# Patient Record
Sex: Female | Born: 2000 | Race: Black or African American | Hispanic: No | Marital: Single | Smoking: Never smoker
Health system: Southern US, Community
[De-identification: ages and names within clinical notes are randomized; demographics above are authoritative.]

## PROBLEM LIST (undated history)

## (undated) ENCOUNTER — Inpatient Hospital Stay (HOSPITAL_COMMUNITY): Payer: Self-pay

## (undated) DIAGNOSIS — E611 Iron deficiency: Secondary | ICD-10-CM

## (undated) HISTORY — PX: NO PAST SURGERIES: SHX2092

---
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
## 2018-11-20 NOTE — L&D Delivery Note (Signed)
Delivery Note Called to bedside to repair a 3rd degree laceration.  Upon arrival, pt was comfortable, not actively bleeding, baby at bedside with father.  CNM reported placing 2 stiches to manage active bleeding and ordering Fentanyl for repair.  Anal sphincter was mostly approximated.  Small vaginal sulcus tears were repaired individually with 2.0 Chromic.  Vaginal mucosa avulsed from rectal space. After vagina repaired, this dead space was closed to 3rd degree.  1.0 Vicryl used to reapproximate perineum, sphincter.  Left labial laceration.  Rectal exam done before and after repair were normal.  Pt tolerated my portion well. ~5-10 ml noted during my portion.   Thurnell Lose 06/29/2019, 7:15 PM

## 2018-11-20 NOTE — L&D Delivery Note (Signed)
Delivery Note   Patient Name: Kristina Barnett DOB: 2001/11/06 MRN: [id number]  Date of admission: 06/29/2019 Delivering MD: Noralyn Pick  Date of delivery: 06/29/19 Type of delivery: SVD  Newborn Data: Live born female  Birth Weight: 7 lb 5.8 oz (3340 g) APGAR: 9, 9  Newborn Delivery   Birth date/time: 06/29/2019 18:08:00 Delivery type: Vaginal, Spontaneous      Harvie Bridge, 18 y.o., @ [redacted]w[redacted]d,  G1P1001, who was admitted for IOL for circumvallete cord insertion. I was called to the room when she progressed +2 station in the second stage of labor.  She pushed for 30/min. There was a tigth vaginal ring, and fetal decels to 60s, pt was unable to push infant head out, pt had good epidural and a midline episiotomy was cut.   She delivered a viable infant, cephalic and restituted to the OA position over an intact perineum.  A nuchal cord   was not identified. The baby was placed on maternal abdomen while initial step of NRP were perfmored (Dry, Stimulated, and warmed). Hat placed on baby for thermoregulation. Delayed cord clamping was performed for 1.5 minutes.  Cord double clamped and cut.  Cord cut by father. Apgar scores were 9 and 9. Prophylactic Pitocin was started in the third stage of labor for active management. The placenta delivered spontaneously, shultz, with a 3 vessel cord and was sent to LD.  Inspection revealed 3rd degree. Dr Simona Huh was called and repaired the 3rd degree, and I finished the second degree tearAn examination of the vaginal vault and cervix was free from lacerations. The uterus was firm, bleeding stable.  The repair was done under local and epidural and was given 169mcg of fentanyl.   Placenta was sent to pathology and umbilical artery blood gas were not sent.  There were no complications during the procedure.  Mom and baby skin to skin following delivery. Left in stable condition.  Maternal Info: Anesthesia:Epidural Episiotomy: Midline Lacerations:   3rd Suture Repair: Dr Simona Huh repaired the third and I repaired the second Est. Blood Loss (mL):  655  Newborn Info:  Baby Sex: female Circumcision: out pt desired Babies Name: Karter APGAR (1 MIN): 9   APGAR (5 MINS): 9   APGAR (10 MINS):     Mom to postpartum.  Baby to Couplet care / Skin to Skin.   [place], [place], NP-C 06/29/19 7:47 PM

## 2018-12-06 ENCOUNTER — Inpatient Hospital Stay (HOSPITAL_COMMUNITY): Payer: Self-pay

## 2018-12-06 ENCOUNTER — Inpatient Hospital Stay (HOSPITAL_COMMUNITY)
Admission: AD | Admit: 2018-12-06 | Discharge: 2018-12-06 | Disposition: A | Discharge status: Home / Self Care | Payer: Self-pay | Source: Ambulatory Visit | Attending: Family Medicine | Admitting: Family Medicine

## 2018-12-06 ENCOUNTER — Encounter (HOSPITAL_COMMUNITY): Payer: Self-pay | Admitting: Emergency Medicine

## 2018-12-06 DIAGNOSIS — O4690 Antepartum hemorrhage, unspecified, unspecified trimester: Secondary | ICD-10-CM

## 2018-12-06 DIAGNOSIS — O209 Hemorrhage in early pregnancy, unspecified: Secondary | ICD-10-CM | POA: Insufficient documentation

## 2018-12-06 DIAGNOSIS — Z3A10 10 weeks gestation of pregnancy: Secondary | ICD-10-CM | POA: Insufficient documentation

## 2018-12-06 LAB — TYPE AND SCREEN
ABO/RH(D): O POS
ANTIBODY SCREEN: NEGATIVE

## 2018-12-06 LAB — URINALYSIS, ROUTINE W REFLEX MICROSCOPIC
Bilirubin Urine: NEGATIVE
GLUCOSE, UA: NEGATIVE mg/dL
Ketones, ur: NEGATIVE mg/dL
NITRITE: POSITIVE — AB
PROTEIN: NEGATIVE mg/dL
Specific Gravity, Urine: 1.021 (ref 1.005–1.030)
pH: 6 (ref 5.0–8.0)

## 2018-12-06 LAB — WET PREP, GENITAL
Sperm: NONE SEEN
Trich, Wet Prep: NONE SEEN
Yeast Wet Prep HPF POC: NONE SEEN

## 2018-12-06 LAB — CBC
HCT: 37.4 % (ref 36.0–46.0)
Hemoglobin: 12.6 g/dL (ref 12.0–15.0)
MCH: 29.6 pg (ref 26.0–34.0)
MCHC: 33.7 g/dL (ref 30.0–36.0)
MCV: 88 fL (ref 80.0–100.0)
Platelets: 264 10*3/uL (ref 150–400)
RBC: 4.25 MIL/uL (ref 3.87–5.11)
RDW: 13.4 % (ref 11.5–15.5)
WBC: 6.4 10*3/uL (ref 4.0–10.5)
nRBC: 0 % (ref 0.0–0.2)

## 2018-12-06 LAB — HCG, QUANTITATIVE, PREGNANCY: hCG, Beta Chain, Quant, S: 170774 m[IU]/mL — ABNORMAL HIGH (ref <5)

## 2018-12-06 LAB — POCT PREGNANCY, URINE: Preg Test, Ur: POSITIVE — AB

## 2018-12-06 MED ORDER — PRENATAL VITAMIN 27-0.8 MG PO TABS: 1.0000 | ORAL_TABLET | Freq: Every day | ORAL | 7 refills | Status: DC

## 2018-12-06 NOTE — MAU Provider Note (Signed)
History    CSN: [id number] Arrival date and time: 12/06/18 2004 First Provider Initiated Contact with Patient 12/06/18 2034     CC: spotting and pregnancy   HPI  17yo G1P0 at 10w6 by LMP who presents to MAU for pregnancy confirmation and new-onset vaginal bleeding which started today. States small amount of light red spotting noticed today. Has just had a panty liner on. Denies recent intercourse. Denies any new sexual partners. Denies history of any sexually transmitted infections. Denies itchy, painful vaginal discharge. Reports occasional nausea, one episode of vomiting a few weeks ago. Feeling more tired. Surprised by desired pregnancy.   OB History    Gravida  1   Para      Term      Preterm      AB      Living        SAB      TAB      Ectopic      Multiple      Live Births              History reviewed. No pertinent past medical history.  History reviewed. No pertinent surgical history.  History reviewed. No pertinent family history.  Social History   Tobacco Use  . Smoking status: Never Smoker  . Smokeless tobacco: Never Used  Substance Use Topics  . Alcohol use: No  . Drug use: No    Allergies: No Known Allergies  Medications Prior to Admission  Medication Sig Dispense Refill Last Dose  . Pseudoeph-Doxylamine-DM-APAP (NYQUIL PO) Take 10 mLs by mouth every 4 (four) hours as needed (flu-like symptoms).   12/06/2013 at Unknown time    Review of Systems  Constitutional: Positive for fatigue. Negative for activity change and fever.  Respiratory: Negative for shortness of breath.   Cardiovascular: Negative for chest pain.  Gastrointestinal: Positive for nausea. Negative for abdominal pain, constipation, diarrhea and vomiting.  Endocrine: Negative for polyuria.  Genitourinary: Positive for vaginal bleeding. Negative for dysuria, pelvic pain, vaginal discharge and vaginal pain.  Musculoskeletal: Negative for back pain.  Neurological: Negative for  headaches.   Physical Exam   Blood pressure (!) 143/73, pulse 96, temperature 97.8 F (36.6 C), resp. rate 18, height 5' 7.5" (1.715 m), weight 76.2 kg, last menstrual period 09/21/2018.  Physical Exam  Nursing note and vitals reviewed. Constitutional: She is oriented to person, place, and time. She appears well-developed and well-nourished. No distress.  HENT:  Head: Normocephalic and atraumatic.  Eyes: Conjunctivae and EOM are normal. No scleral icterus.  Cardiovascular: Normal rate.  Respiratory: Effort normal and breath sounds normal. She has no wheezes.  GI: Soft. There is no abdominal tenderness. There is no guarding.  Genitourinary:    Genitourinary Comments: Normal appearing vulva and vagina  cervical ectropion noted with some friability, no obvious bleeding from os, cervix visually closed  no CMT, ovarian fullness or tenderness, anteverted uterus    Musculoskeletal:        General: No edema.  Neurological: She is alert and oriented to person, place, and time.  Skin: Skin is warm and dry. No rash noted.  Psychiatric: She has a normal mood and affect. Her behavior is normal.    MAU Course  Procedures  MDM -- given vaginal bleeding and pregnancy of unknown location, need to rule out ectopic pregnancy with U/S -- will also check CBC, T&S and quant hCG level - hCG 170,774 and O+ blood type  -- wet prep with evidence of  BV but asymptomatic, G/C sent -- U/A with evidence of possible UTI but asymptomatic, will send for OB culture   US OB Comp Less 14 Wks CLINICAL DATA:  Pregnant patient with vaginal bleeding.  EXAM: OBSTETRIC <14 WK ULTRASOUND  TECHNIQUE: Transabdominal ultrasound was performed for evaluation of the gestation as well as the maternal uterus and adnexal regions.  COMPARISON:  None.  FINDINGS: Intrauterine gestational sac: Single  Yolk sac:  Not Visualized.  Embryo:  Visualized.  Cardiac Activity: Visualized.  Heart Rate: 171 bpm  MSD:    mm     w     d  CRL:   40.1 mm   10 w 6 d                  [facility]: June 28, 2019  Subchorionic hemorrhage:  None visualized.  Maternal uterus/adnexae: Normal.  IMPRESSION: Single live IUP.  No cause for vaginal bleeding identified.  Electronically Signed   By: Gerome Sam III M.D   On: 12/06/2018 22:04   Assessment and Plan  17yo G1P0 at [redacted]w[redacted]d who presents for pregnancy test and vaginal bleeding. On exam, cervix with friable ectropion but no obvious signs of bleeding coming from os. Live IUP with FHR 170s identified on U/S. Recommended establishing prenatal care and taking prenatal vitamins. Bleeding and first trimester precautions reviewed. Patient also with signs of UTI on U/A but completely asymptomatic. Will follow-up OB urine culture before pursuing treatment. All questions answered and patient discharged home.   Tamera Stands, DO  12/06/2018, 9:12 PM

## 2018-12-06 NOTE — Discharge Instructions (Signed)
Vaginal Bleeding During Pregnancy, First Trimester ° °A small amount of bleeding from the vagina (spotting) is relatively common during early pregnancy. It usually stops on its own. Various things may cause bleeding or spotting during early pregnancy. Some bleeding may be related to the pregnancy, and some may not. In many cases, the bleeding is normal and is not a problem. However, bleeding can also be a sign of something serious. Be sure to tell your health care provider about any vaginal bleeding right away. °Some possible causes of vaginal bleeding during the first trimester include: °· Infection or inflammation of the cervix. °· Growths (polyps) on the cervix. °· Miscarriage or threatened miscarriage. °· Pregnancy tissue developing outside of the uterus (ectopic pregnancy). °· A mass of tissue developing in the uterus due to an egg being fertilized incorrectly (molar pregnancy). °Follow these instructions at home: °Activity °· Follow instructions from your health care provider about limiting your activity. Ask what activities are safe for you. °· If needed, make plans for someone to help with your regular activities. °· Do not have sex or orgasms until your health care provider says that this is safe. °General instructions °· Take over-the-counter and prescription medicines only as told by your health care provider. °· Pay attention to any changes in your symptoms. °· Do not use tampons or douche. °· Write down how many pads you use each day, how often you change pads, and how soaked (saturated) they are. °· If you pass any tissue from your vagina, save the tissue so you can show it to your health care provider. °· Keep all follow-up visits as told by your health care provider. This is important. °Contact a health care provider if: °· You have vaginal bleeding during any part of your pregnancy. °· You have cramps or labor pains. °· You have a fever. °Get help right away if: °· You have severe cramps in your  back or abdomen. °· You pass large clots or a large amount of tissue from your vagina. °· Your bleeding increases. °· You feel light-headed or weak, or you faint. °· You have chills. °· You are leaking fluid or have a gush of fluid from your vagina. °Summary °· A small amount of bleeding (spotting) from the vagina is relatively common during early pregnancy. °· Various things may cause bleeding or spotting in early pregnancy. °· Be sure to tell your health care provider about any vaginal bleeding right away. °This information is not intended to replace advice given to you by your health care provider. Make sure you discuss any questions you have with your health care provider. °Document Released: 08/16/2005 Document Revised: 02/08/2017 Document Reviewed: 02/08/2017 °Elsevier Interactive Patient Education © 2019 [employer] ° °

## 2018-12-06 NOTE — MAU Note (Signed)
I came to take pregnancy test. No period for 2 months. LMP 09/21/18. Has not taken upt. No birth control. Spotting today. No pain.

## 2018-12-07 LAB — ABO/RH: ABO/RH(D): O POS

## 2018-12-09 ENCOUNTER — Other Ambulatory Visit: Payer: Self-pay | Admitting: Family Medicine

## 2018-12-09 ENCOUNTER — Telehealth: Payer: Self-pay | Admitting: *Deleted

## 2018-12-09 ENCOUNTER — Telehealth: Payer: Self-pay | Admitting: Family Medicine

## 2018-12-09 ENCOUNTER — Encounter: Payer: Self-pay | Admitting: Family Medicine

## 2018-12-09 DIAGNOSIS — R8271 Bacteriuria: Secondary | ICD-10-CM

## 2018-12-09 DIAGNOSIS — O98819 Other maternal infectious and parasitic diseases complicating pregnancy, unspecified trimester: Secondary | ICD-10-CM

## 2018-12-09 DIAGNOSIS — O99891 Other specified diseases and conditions complicating pregnancy: Secondary | ICD-10-CM | POA: Insufficient documentation

## 2018-12-09 DIAGNOSIS — A749 Chlamydial infection, unspecified: Secondary | ICD-10-CM

## 2018-12-09 DIAGNOSIS — O9989 Other specified diseases and conditions complicating pregnancy, childbirth and the puerperium: Principal | ICD-10-CM

## 2018-12-09 LAB — CULTURE, OB URINE: Culture: >=100000 — AB

## 2018-12-09 LAB — GC/CHLAMYDIA PROBE AMP (~~LOC~~) NOT AT ARMC
Chlamydia: POSITIVE — AB
Neisseria Gonorrhea: NEGATIVE

## 2018-12-09 MED ORDER — CEFDINIR 300 MG PO CAPS: 300.0000 mg | ORAL_CAPSULE | Freq: Two times a day (BID) | ORAL | 0 refills | Status: DC

## 2018-12-09 MED ORDER — AZITHROMYCIN 250 MG PO TABS: 1000.0000 mg | ORAL_TABLET | Freq: Once | ORAL | 0 refills | Status: AC

## 2018-12-09 NOTE — Telephone Encounter (Signed)
Called patient and left voicemail regarding positive urine culture. Stated "results to review" and "medication at pharmacy" but no other details since no identifiable name in voicemail. Discussed urine culture results with pharmacy given resistance pattern - opted to treat with cefdinir 300mg  BID x 5 days. Will plan to repeat urine culture at first OB visit assuming started treatment. Patient had not decided in MAU where she would go to seek care.   Kristina Barnett. Earlene Plater, DO OB/GYN Fellow

## 2018-12-09 NOTE — Telephone Encounter (Signed)
Kristina Barnett left a voicemail this pm that she missed Dr.Wallace's call about her results.  I called Kristina Barnett and informed her Dr.Wallace wanted her to know she tested postive for a UTI and Dr. Earlene Plater sent a prescription for [facility] to her pharmacy and she should take all of it. I also informed her Dr. Earlene Plater recommended she get another urine culture at her first ob visit. She states she has not yet decided where she will go ;but voices understanding.

## 2018-12-09 NOTE — Telephone Encounter (Signed)
Called pt and informed her of +Chlamydia test result. Pt advised that a prescription has been sent to her pharmacy and dosage instructions were reviewed. Pt also advised that her partner requires treatment which may be received by his PCP or [facility]. She needs to wait for 2 weeks after taking Rx to have SI and 2 weeks after partner has been treated before having SI with him.  Pt voiced understanding. STI form completed and faxed to [facility].

## 2018-12-11 ENCOUNTER — Telehealth: Payer: Self-pay

## 2018-12-11 DIAGNOSIS — A749 Chlamydial infection, unspecified: Secondary | ICD-10-CM

## 2018-12-11 DIAGNOSIS — R8271 Bacteriuria: Secondary | ICD-10-CM

## 2018-12-11 DIAGNOSIS — O9989 Other specified diseases and conditions complicating pregnancy, childbirth and the puerperium: Secondary | ICD-10-CM

## 2018-12-11 MED ORDER — AZITHROMYCIN 1 G PO PACK: 1.0000 g | PACK | Freq: Once | ORAL | 0 refills | Status: AC

## 2018-12-11 MED ORDER — CEFDINIR 300 MG PO CAPS: 300.0000 mg | ORAL_CAPSULE | Freq: Two times a day (BID) | ORAL | 0 refills | Status: AC

## 2018-12-11 NOTE — Telephone Encounter (Signed)
Called pt and advised that we would send Azithromycin powder 1000 mg and Cefdinir capsules to her [facility] pharmacy. Asked patient would she be able to tolerate that, Patient stated that that would be okay and that she will pick up the Rx tomorrow and take it. Pt verbalized understanding and had no questions.

## 2018-12-11 NOTE — Telephone Encounter (Signed)
Patient called and stated that over the weekend she found out she was pregnant and that she had chlamydia she was given the medication to take but she has a hard time taking pills and threw them up so shes sure they are not in her system. Patient stated she was wondering if she could take something like a shot to help her cure it. Patient requests a call back.

## 2018-12-11 NOTE — Telephone Encounter (Signed)
Returned call to pt and left VM stating that I am calling to discuss her concern. Please call back and leave a message stating whether we may leave detailed information on her VM. Pt can be informed that we can prescribe the Azithromycin powder 1000 mg to treat the +Chlamydia. Per chart review, pt was prescribed Cefdinir capsules to treat +UTI - need to be sure pt is able to take that medication.

## 2019-01-07 ENCOUNTER — Ambulatory Visit (INDEPENDENT_AMBULATORY_CARE_PROVIDER_SITE_OTHER): Payer: Medicaid Other | Admitting: Obstetrics and Gynecology

## 2019-01-07 ENCOUNTER — Other Ambulatory Visit (HOSPITAL_COMMUNITY)
Admission: RE | Admit: 2019-01-07 | Discharge: 2019-01-07 | Disposition: A | Discharge status: Home / Self Care | Payer: Medicaid Other | Source: Ambulatory Visit | Attending: Obstetrics and Gynecology | Admitting: Obstetrics and Gynecology

## 2019-01-07 ENCOUNTER — Encounter: Payer: Self-pay | Admitting: Obstetrics and Gynecology

## 2019-01-07 VITALS — BP 128/77 | HR 112 | Wt 169.4 lb

## 2019-01-07 DIAGNOSIS — O9833 Other infections with a predominantly sexual mode of transmission complicating the puerperium: Secondary | ICD-10-CM

## 2019-01-07 DIAGNOSIS — O99891 Other specified diseases and conditions complicating pregnancy: Secondary | ICD-10-CM

## 2019-01-07 DIAGNOSIS — Z3482 Encounter for supervision of other normal pregnancy, second trimester: Secondary | ICD-10-CM

## 2019-01-07 DIAGNOSIS — Z23 Encounter for immunization: Secondary | ICD-10-CM

## 2019-01-07 DIAGNOSIS — O98811 Other maternal infectious and parasitic diseases complicating pregnancy, first trimester: Secondary | ICD-10-CM | POA: Diagnosis present

## 2019-01-07 DIAGNOSIS — A749 Chlamydial infection, unspecified: Secondary | ICD-10-CM

## 2019-01-07 DIAGNOSIS — O9989 Other specified diseases and conditions complicating pregnancy, childbirth and the puerperium: Secondary | ICD-10-CM

## 2019-01-07 DIAGNOSIS — R8271 Bacteriuria: Secondary | ICD-10-CM

## 2019-01-07 DIAGNOSIS — Z3490 Encounter for supervision of normal pregnancy, unspecified, unspecified trimester: Secondary | ICD-10-CM | POA: Insufficient documentation

## 2019-01-07 DIAGNOSIS — Z3492 Encounter for supervision of normal pregnancy, unspecified, second trimester: Secondary | ICD-10-CM

## 2019-01-07 DIAGNOSIS — O98812 Other maternal infectious and parasitic diseases complicating pregnancy, second trimester: Secondary | ICD-10-CM

## 2019-01-07 DIAGNOSIS — Z3A15 15 weeks gestation of pregnancy: Secondary | ICD-10-CM

## 2019-01-07 MED ORDER — CEFDINIR 250 MG/5ML PO SUSR: 300.0000 mg | Freq: Two times a day (BID) | ORAL | 0 refills | Status: AC

## 2019-01-07 MED ORDER — VITAFOL GUMMIES 3.33-0.333-34.8 MG PO CHEW: 3.0000 | CHEWABLE_TABLET | Freq: Every day | ORAL | 11 refills | Status: DC

## 2019-01-07 NOTE — Progress Notes (Signed)
Subjective:  Kristina Barnett is a 18 y.o. G1P0 at [redacted]w[redacted]d being seen today for her first OB visit. EDD by first trimester U/S. No chronic medical problems or medications. H/O CT, S/P Tx. H/O UTI did not take medication.  She is currently monitored for the following issues for this low-risk pregnancy and has Asymptomatic bacteriuria during pregnancy; Chlamydia infection affecting pregnancy; and Supervision of normal pregnancy, antepartum on their problem list.  Patient reports no complaints.   . Vag. Bleeding: None.  Movement: Absent. Denies leaking of fluid.   The following portions of the patient's history were reviewed and updated as appropriate: allergies, current medications, past family history, past medical history, past social history, past surgical history and problem list. Problem list updated.  Objective:   Vitals:   01/07/19 1411  BP: 128/77  Pulse: (!) 112  Weight: 169 lb 6.4 oz (76.8 kg)    Fetal Status: Fetal Heart Rate (bpm): 152   Movement: Absent     General:  Alert, oriented and cooperative. Patient is in no acute distress.  Skin: Skin is warm and dry. No rash noted.   Cardiovascular: Normal heart rate noted  Respiratory: Normal respiratory effort, no problems with respiration noted  Abdomen: Soft, gravid, appropriate for gestational age. Pain/Pressure: Absent     Pelvic:  Cervical exam deferred        Extremities: Normal range of motion.  Edema: None  Mental Status: Normal mood and affect. Normal behavior. Normal judgment and thought content.   Urinalysis:      Assessment and Plan:  Pregnancy: G1P0 at [redacted]w[redacted]d  1. Encounter for supervision of normal pregnancy, antepartum, unspecified gravidity Prenatal labs and care reviewed with pt.  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  Dispense: 90 tablet; Refill: 11  2. Flu vaccine need  - Flu Vaccine QUAD 36+ mos IM (Fluarix, Quad PF)  3. Chlamydia infection affecting pregnancy in first trimester TOC today - Urine cytology ancillary only  4. Asymptomatic bacteriuria during pregnancy Rx sent to pharmacy - cefdinir (OMNICEF) 250 MG/5ML suspension; Take 6 mLs (300 mg total) by mouth 2 (two) times daily for 5 days.  Dispense: 60 mL; Refill: 0  Preterm labor symptoms and general obstetric precautions including but not limited to vaginal bleeding, contractions, leaking of fluid and fetal movement were reviewed in detail with the patient. Please refer to After Visit Summary for other counseling recommendations.  Return in about 4 weeks (around 02/04/2019) for OB visit.   Hermina Staggers, MD

## 2019-01-07 NOTE — Patient Instructions (Signed)

## 2019-01-07 NOTE — Progress Notes (Signed)
NOB Planned :No  Genetic Screening Desires  Flu vaccine : desires.  U/S done on 12/06/18  Requesting Prenatal Gummie pt unable to swallow pills. Pt mother states if Ins will not cover she will pay cost.

## 2019-01-08 LAB — URINE CYTOLOGY ANCILLARY ONLY
Chlamydia: NEGATIVE
Neisseria Gonorrhea: NEGATIVE
Trichomonas: NEGATIVE

## 2019-01-10 LAB — URINE CYTOLOGY ANCILLARY ONLY: Candida vaginitis: NEGATIVE

## 2019-01-10 LAB — CULTURE, OB URINE

## 2019-01-10 LAB — URINE CULTURE, OB REFLEX

## 2019-01-16 ENCOUNTER — Other Ambulatory Visit: Payer: Self-pay

## 2019-01-16 ENCOUNTER — Telehealth: Payer: Self-pay

## 2019-01-16 MED ORDER — CEPHALEXIN 500 MG PO CAPS: 500.0000 mg | ORAL_CAPSULE | Freq: Two times a day (BID) | ORAL | 0 refills | Status: AC

## 2019-01-16 NOTE — Telephone Encounter (Signed)
-----   Message from Hermina Staggers, MD sent at 01/12/2019  5:01 AM EST ----- Keflex 500 mg po bid x 7 days Thanks Casimiro Needle

## 2019-01-16 NOTE — Progress Notes (Signed)
Rx sent to pharmacy and patient notified.   

## 2019-01-16 NOTE — Telephone Encounter (Signed)
Patient notified of Rx sent to her Pharmacy.

## 2019-01-17 LAB — OBSTETRIC PANEL, INCLUDING HIV
ANTIBODY SCREEN: NEGATIVE
BASOS: 0 %
Basophils Absolute: 0 10*3/uL (ref 0.0–0.2)
EOS (ABSOLUTE): 0.1 10*3/uL (ref 0.0–0.4)
EOS: 1 %
HIV SCREEN 4TH GENERATION: NONREACTIVE
Hematocrit: 35 % (ref 34.0–46.6)
Hemoglobin: 12 g/dL (ref 11.1–15.9)
Hepatitis B Surface Ag: NEGATIVE
Immature Grans (Abs): 0 10*3/uL (ref 0.0–0.1)
Immature Granulocytes: 0 %
Lymphocytes Absolute: 1.6 10*3/uL (ref 0.7–3.1)
Lymphs: 23 %
MCH: 30.5 pg (ref 26.6–33.0)
MCHC: 34.3 g/dL (ref 31.5–35.7)
MCV: 89 fL (ref 79–97)
Monocytes Absolute: 0.5 10*3/uL (ref 0.1–0.9)
Monocytes: 7 %
Neutrophils Absolute: 4.9 10*3/uL (ref 1.4–7.0)
Neutrophils: 69 %
Platelets: 245 10*3/uL (ref 150–450)
RBC: 3.94 x10E6/uL (ref 3.77–5.28)
RDW: 13.6 % (ref 11.7–15.4)
RH TYPE: POSITIVE
RPR Ser Ql: NONREACTIVE
Rubella Antibodies, IGG: 1.91 index (ref >0.99)
WBC: 7.1 10*3/uL (ref 3.4–10.8)

## 2019-01-17 LAB — AFP, SERUM, OPEN SPINA BIFIDA
AFP MoM: 0.73
AFP Value: 21.6 ng/mL
Gest. Age on Collection Date: 15 weeks
Maternal Age At EDD: 20 yr
OSBR Risk 1 IN: 10000
Test Results:: NEGATIVE
Weight: 169 [lb_av]

## 2019-01-17 LAB — HEMOGLOBINOPATHY EVALUATION
HEMOGLOBIN A2 QUANTITATION: 2.3 % (ref 1.8–3.2)
HGB C: 0 %
HGB S: 0 %
HGB VARIANT: 0 %
Hemoglobin F Quantitation: 0 % (ref 0.0–2.0)
Hgb A: 97.7 % (ref 96.4–98.8)

## 2019-01-17 LAB — CYSTIC FIBROSIS MUTATION 97: Interpretation: NOT DETECTED

## 2019-01-17 LAB — SMN1 COPY NUMBER ANALYSIS (SMA CARRIER SCREENING)

## 2019-01-28 ENCOUNTER — Encounter (HOSPITAL_COMMUNITY): Payer: Self-pay

## 2019-02-04 ENCOUNTER — Ambulatory Visit (HOSPITAL_COMMUNITY): Payer: Medicaid Other | Attending: Obstetrics and Gynecology

## 2019-02-04 ENCOUNTER — Encounter: Payer: Medicaid Other | Admitting: Obstetrics and Gynecology

## 2019-06-27 ENCOUNTER — Other Ambulatory Visit: Payer: Self-pay | Admitting: Obstetrics and Gynecology

## 2019-06-28 DIAGNOSIS — O43113 Circumvallate placenta, third trimester: Secondary | ICD-10-CM

## 2019-06-28 NOTE — H&P (Signed)
Kristina Barnett is a 18 y.o. female, G1P0 at 37 weeks, presenting for induction of labor for circumvallate placenta.  Prenatal hx remarkable for treatment of UTI x 2 and Chlamydia infection during the pregnancy.  Test of cures negative for both.  GBS positive  Patient Active Problem List   Diagnosis Date Noted  . Circumvallate placenta during pregnancy in third trimester, antepartum 06/28/2019  . Supervision of normal pregnancy, antepartum 01/07/2019  . Asymptomatic bacteriuria during pregnancy 12/09/2018  . Chlamydia infection affecting pregnancy 12/09/2018    History of present pregnancy: Patient entered care at 10 weeks.  Transfer to [facility] at 19 weeks EDC of 06/28/2019 was established by LMP/US.   Anatomy scan:  19 weeks, with normal findings and an anterior placenta with circumvallate cord insertion, slight lemon sign of head Additional [place] evaluations:  [place] to for growth normal [place] Significant prenatal events: MFM reported not a true lemon sign due to neg genetic test   Last evaluation: this week  OB History    Gravida  1   Para      Term      Preterm      AB      Living        SAB      TAB      Ectopic      Multiple      Live Births             No past medical history on file. No past surgical history on file. Family History: family history is not on file. Social History:  reports that she has never smoked. She has never used smokeless tobacco. She reports that she does not drink alcohol or use drugs.   Prenatal Transfer Tool  Maternal Diabetes: No Genetic Screening: Normal Maternal Ultrasounds/Referrals: Normal Fetal Ultrasounds or other Referrals:  None Maternal Substance Abuse:  No Significant Maternal Medications:  None Significant Maternal Lab Results: Group B Strep positive  TDAP Yes Flu No  ROS:  All ten systems reviewed and negative except as stated above  No Known Allergies     Last menstrual period 09/21/2018.  Chest clear Heart  RRR without murmur Abd gravid, NT, FH appropriate Pelvic: per RN Ext:Neg  FHR: Category 1 FHT 140 accels no decels UCs:  occ  Prenatal labs: ABO, Rh: O/Positive/-- (02/18 1519) Antibody: Negative (02/18 1519) Rubella:  1.91 (02/18 1519) RPR: Non Reactive (02/18 1519)  HBsAg: Negative (02/18 1519)  HIV: Non Reactive (02/18 1519)  GBS:  Positive Sickle cell/Hgb electrophoresis:  AA GC: Neg Chlamydia: Pos TOC negative Genetic screenings: NIPT neg AFP neg Glucola:  65 Other:   Hgb 12 at NOB, 10.6 at 28 weeks       Assessment/Plan: IUP at 40 weeks with circumvallate cord insertion Cat 1 strip GBS positive  Plan: Admit to [place]  Routine [facility] orders Pain med/epidural prn PCN G for GBS prophylaxis  Pleas Koch ProtheroCNM, MSN 06/29/2019, 12:34 AM

## 2019-06-29 ENCOUNTER — Inpatient Hospital Stay (HOSPITAL_COMMUNITY)
Admission: AD | Admit: 2019-06-29 | Discharge: 2019-07-01 | DRG: 768 | Disposition: A | Discharge status: Home / Self Care | Payer: Medicaid Other | Attending: Obstetrics and Gynecology | Admitting: Obstetrics and Gynecology

## 2019-06-29 ENCOUNTER — Encounter (HOSPITAL_COMMUNITY): Payer: Self-pay | Admitting: *Deleted

## 2019-06-29 ENCOUNTER — Inpatient Hospital Stay (HOSPITAL_COMMUNITY): Payer: Medicaid Other | Admitting: Anesthesiology

## 2019-06-29 ENCOUNTER — Inpatient Hospital Stay (HOSPITAL_COMMUNITY): Payer: Medicaid Other

## 2019-06-29 ENCOUNTER — Other Ambulatory Visit: Payer: Self-pay

## 2019-06-29 DIAGNOSIS — O43113 Circumvallate placenta, third trimester: Secondary | ICD-10-CM | POA: Diagnosis present

## 2019-06-29 DIAGNOSIS — O99824 Streptococcus B carrier state complicating childbirth: Secondary | ICD-10-CM | POA: Diagnosis present

## 2019-06-29 DIAGNOSIS — Z3A40 40 weeks gestation of pregnancy: Secondary | ICD-10-CM | POA: Diagnosis not present

## 2019-06-29 DIAGNOSIS — Z20828 Contact with and (suspected) exposure to other viral communicable diseases: Secondary | ICD-10-CM | POA: Diagnosis present

## 2019-06-29 DIAGNOSIS — Z3400 Encounter for supervision of normal first pregnancy, unspecified trimester: Secondary | ICD-10-CM

## 2019-06-29 DIAGNOSIS — O9081 Anemia of the puerperium: Secondary | ICD-10-CM | POA: Diagnosis not present

## 2019-06-29 LAB — CBC
HCT: 30 % — ABNORMAL LOW (ref 36.0–46.0)
Hemoglobin: 9.5 g/dL — ABNORMAL LOW (ref 12.0–15.0)
MCH: 26 pg (ref 26.0–34.0)
MCHC: 31.7 g/dL (ref 30.0–36.0)
MCV: 82.2 fL (ref 80.0–100.0)
Platelets: 212 10*3/uL (ref 150–400)
RBC: 3.65 MIL/uL — ABNORMAL LOW (ref 3.87–5.11)
RDW: 14.7 % (ref 11.5–15.5)
WBC: 6 10*3/uL (ref 4.0–10.5)
nRBC: 0 % (ref 0.0–0.2)

## 2019-06-29 LAB — TYPE AND SCREEN
ABO/RH(D): O POS
Antibody Screen: NEGATIVE

## 2019-06-29 LAB — ABO/RH: ABO/RH(D): O POS

## 2019-06-29 LAB — RPR: RPR Ser Ql: NONREACTIVE

## 2019-06-29 LAB — SARS CORONAVIRUS 2 BY RT PCR (HOSPITAL ORDER, PERFORMED IN ~~LOC~~ HOSPITAL LAB): SARS Coronavirus 2: NEGATIVE

## 2019-06-29 MED ORDER — SIMETHICONE 80 MG PO CHEW: 80.0000 mg | CHEWABLE_TABLET | ORAL | Status: DC | PRN

## 2019-06-29 MED ORDER — FENTANYL CITRATE (PF) 100 MCG/2ML IJ SOLN
INTRAMUSCULAR | Status: AC
  Filled 2019-06-29: qty 2

## 2019-06-29 MED ORDER — DIBUCAINE (PERIANAL) 1 % EX OINT: 1.0000 "application " | TOPICAL_OINTMENT | CUTANEOUS | Status: DC | PRN

## 2019-06-29 MED ORDER — FENTANYL-BUPIVACAINE-NACL 0.5-0.125-0.9 MG/250ML-% EP SOLN
12.0000 mL/h | EPIDURAL | Status: DC | PRN
  Filled 2019-06-29: qty 250

## 2019-06-29 MED ORDER — ONDANSETRON HCL 4 MG/2ML IJ SOLN: 4.0000 mg | INTRAMUSCULAR | Status: DC | PRN

## 2019-06-29 MED ORDER — PRENATAL MULTIVITAMIN CH
1.0000 | ORAL_TABLET | Freq: Every day | ORAL | Status: DC
  Administered 2019-06-30 – 2019-07-01 (×2): 1 via ORAL
  Filled 2019-06-29 (×2): qty 1

## 2019-06-29 MED ORDER — ACETAMINOPHEN 325 MG PO TABS: 650.0000 mg | ORAL_TABLET | ORAL | Status: DC | PRN

## 2019-06-29 MED ORDER — WITCH HAZEL-GLYCERIN EX PADS: 1.0000 "application " | MEDICATED_PAD | CUTANEOUS | Status: DC | PRN

## 2019-06-29 MED ORDER — LACTATED RINGERS IV SOLN: 500.0000 mL | INTRAVENOUS | Status: DC | PRN

## 2019-06-29 MED ORDER — ONDANSETRON HCL 4 MG/2ML IJ SOLN: 4.0000 mg | Freq: Four times a day (QID) | INTRAMUSCULAR | Status: DC | PRN

## 2019-06-29 MED ORDER — OXYTOCIN BOLUS FROM INFUSION
500.0000 mL | Freq: Once | INTRAVENOUS | Status: AC
  Administered 2019-06-29: 18:00:00 500 mL via INTRAVENOUS

## 2019-06-29 MED ORDER — ONDANSETRON HCL 4 MG PO TABS: 4.0000 mg | ORAL_TABLET | ORAL | Status: DC | PRN

## 2019-06-29 MED ORDER — SOD CITRATE-CITRIC ACID 500-334 MG/5ML PO SOLN: 30.0000 mL | ORAL | Status: DC | PRN

## 2019-06-29 MED ORDER — DIPHENHYDRAMINE HCL 25 MG PO CAPS: 25.0000 mg | ORAL_CAPSULE | Freq: Four times a day (QID) | ORAL | Status: DC | PRN

## 2019-06-29 MED ORDER — IBUPROFEN 600 MG PO TABS
600.0000 mg | ORAL_TABLET | Freq: Four times a day (QID) | ORAL | Status: DC
  Filled 2019-06-29: qty 1

## 2019-06-29 MED ORDER — MISOPROSTOL 25 MCG QUARTER TABLET
25.0000 ug | ORAL_TABLET | ORAL | Status: DC | PRN
  Administered 2019-06-29 (×2): 25 ug via VAGINAL
  Filled 2019-06-29 (×2): qty 1

## 2019-06-29 MED ORDER — PHENYLEPHRINE 40 MCG/ML (10ML) SYRINGE FOR IV PUSH (FOR BLOOD PRESSURE SUPPORT)
80.0000 ug | PREFILLED_SYRINGE | INTRAVENOUS | Status: DC | PRN
  Filled 2019-06-29: qty 10

## 2019-06-29 MED ORDER — LIDOCAINE HCL (PF) 1 % IJ SOLN
INTRAMUSCULAR | Status: DC | PRN
  Administered 2019-06-29 (×2): 5 mL via EPIDURAL

## 2019-06-29 MED ORDER — SODIUM CHLORIDE (PF) 0.9 % IJ SOLN
INTRAMUSCULAR | Status: DC | PRN
  Administered 2019-06-29: 12 mL/h via EPIDURAL

## 2019-06-29 MED ORDER — OXYCODONE-ACETAMINOPHEN 5-325 MG PO TABS: 1.0000 | ORAL_TABLET | ORAL | Status: DC | PRN

## 2019-06-29 MED ORDER — BENZOCAINE-MENTHOL 20-0.5 % EX AERO
1.0000 "application " | INHALATION_SPRAY | CUTANEOUS | Status: DC | PRN
  Administered 2019-06-29: 1 via TOPICAL
  Filled 2019-06-29: qty 56

## 2019-06-29 MED ORDER — TERBUTALINE SULFATE 1 MG/ML IJ SOLN: 0.2500 mg | Freq: Once | INTRAMUSCULAR | Status: DC | PRN

## 2019-06-29 MED ORDER — LACTATED RINGERS IV SOLN
500.0000 mL | Freq: Once | INTRAVENOUS | Status: AC
  Administered 2019-06-29: 500 mL via INTRAVENOUS

## 2019-06-29 MED ORDER — OXYCODONE-ACETAMINOPHEN 5-325 MG PO TABS: 2.0000 | ORAL_TABLET | ORAL | Status: DC | PRN

## 2019-06-29 MED ORDER — FENTANYL CITRATE (PF) 100 MCG/2ML IJ SOLN
100.0000 ug | Freq: Once | INTRAMUSCULAR | Status: AC
  Administered 2019-06-29: 19:00:00 100 ug via INTRAVENOUS

## 2019-06-29 MED ORDER — PENICILLIN G 3 MILLION UNITS IVPB - SIMPLE MED
3.0000 10*6.[IU] | INTRAVENOUS | Status: DC
  Administered 2019-06-29 (×3): 3 10*6.[IU] via INTRAVENOUS
  Filled 2019-06-29 (×3): qty 100

## 2019-06-29 MED ORDER — SENNOSIDES-DOCUSATE SODIUM 8.6-50 MG PO TABS
2.0000 | ORAL_TABLET | ORAL | Status: DC
  Filled 2019-06-29: qty 2

## 2019-06-29 MED ORDER — OXYTOCIN 40 UNITS IN NORMAL SALINE INFUSION - SIMPLE MED
1.0000 m[IU]/min | INTRAVENOUS | Status: DC
  Administered 2019-06-29: 2 m[IU]/min via INTRAVENOUS

## 2019-06-29 MED ORDER — SODIUM CHLORIDE 0.9 % IV SOLN
5.0000 10*6.[IU] | Freq: Once | INTRAVENOUS | Status: AC
  Administered 2019-06-29: 5 10*6.[IU] via INTRAVENOUS
  Filled 2019-06-29: qty 5

## 2019-06-29 MED ORDER — PHENYLEPHRINE 40 MCG/ML (10ML) SYRINGE FOR IV PUSH (FOR BLOOD PRESSURE SUPPORT)
80.0000 ug | PREFILLED_SYRINGE | INTRAVENOUS | Status: DC | PRN
  Administered 2019-06-29: 80 ug via INTRAVENOUS

## 2019-06-29 MED ORDER — IBUPROFEN 100 MG/5ML PO SUSP
600.0000 mg | Freq: Four times a day (QID) | ORAL | Status: DC
  Administered 2019-06-30 (×3): 600 mg via ORAL
  Filled 2019-06-29 (×4): qty 30

## 2019-06-29 MED ORDER — EPHEDRINE 5 MG/ML INJ: 10.0000 mg | INTRAVENOUS | Status: DC | PRN

## 2019-06-29 MED ORDER — DIPHENHYDRAMINE HCL 50 MG/ML IJ SOLN: 12.5000 mg | INTRAMUSCULAR | Status: DC | PRN

## 2019-06-29 MED ORDER — COCONUT OIL OIL: 1.0000 "application " | TOPICAL_OIL | Status: DC | PRN

## 2019-06-29 MED ORDER — ZOLPIDEM TARTRATE 5 MG PO TABS: 5.0000 mg | ORAL_TABLET | Freq: Every evening | ORAL | Status: DC | PRN

## 2019-06-29 MED ORDER — LIDOCAINE HCL (PF) 1 % IJ SOLN
30.0000 mL | INTRAMUSCULAR | Status: AC | PRN
  Administered 2019-06-29: 30 mL via SUBCUTANEOUS
  Filled 2019-06-29: qty 30

## 2019-06-29 MED ORDER — LACTATED RINGERS IV SOLN
INTRAVENOUS | Status: DC
  Administered 2019-06-29 (×3): via INTRAVENOUS

## 2019-06-29 MED ORDER — OXYTOCIN 40 UNITS IN NORMAL SALINE INFUSION - SIMPLE MED
2.5000 [IU]/h | INTRAVENOUS | Status: DC
  Filled 2019-06-29: qty 1000

## 2019-06-29 MED ORDER — TETANUS-DIPHTH-ACELL PERTUSSIS 5-2.5-18.5 LF-MCG/0.5 IM SUSP: 0.5000 mL | Freq: Once | INTRAMUSCULAR | Status: DC

## 2019-06-29 NOTE — Progress Notes (Signed)
Pt up to bathroom to try to void and change pad. Assisted pt to bathroom on steady. Pt able to void but became very dizzy while sitting on toilet. Assistance called for and arrived quickly. Pad quickly changed and pt assisted back to bed on steady with 2 RN assist. BP when back to bed 120/63. Cool cloth to forehead and encouraged pt to eat and drink, as she has not yet done so since delivery. Pt reports feeling much better back in bed. Will continue to monitor. Pt instructed to not get out of bed by herself and to call for assistance. Pt expressed understanding.

## 2019-06-29 NOTE — Progress Notes (Signed)
Labor Progress Note  Kristina Barnett, 18 y.o., G1P0, with an IUP @ [redacted]w[redacted]d, presented for presented for IOL for circumvallate cord insertion.   Subjective: Pt resting in bed alone, BF gone to get food, pt stable , endorses feeling mild cxt but tolerates well, wants epidural when feels more pain, pt aware of R/B/A for pitocin and agreeable to starting.  Patient Active Problem List   Diagnosis Date Noted  . Circumvallate placenta during pregnancy in third trimester, antepartum 06/28/2019  . Supervision of normal pregnancy, antepartum 01/07/2019  . Asymptomatic bacteriuria during pregnancy 12/09/2018  . Chlamydia infection affecting pregnancy 12/09/2018   Objective: BP 117/68   Pulse 87   Temp 99.5 F (37.5 C) (Oral)   Resp 18   Ht 5' 7.5" (1.715 m)   Wt 86.3 kg   LMP 09/21/2018   BMI 29.37 kg/m  No intake/output data recorded. No intake/output data recorded. NST: FHR baseline 120 bpm, Variability: moderate, Accelerations:present, Decelerations:  Absent= Cat 1/Reactive CTX:  none Uterus gravid, soft non tender, moderate to palpate with contractions.  SVE:  Dilation: 3 Effacement (%): 70 Station: -2 Exam by:: [employer], CNM Pitocin at (Start)mUn/min  Assessment:  Kristina Barnett, 18 y.o., G1P0, with an IUP @ [redacted]w[redacted]d, presented for IOL for circumvallate cord insertion. Pt stable and progress with x2 doses of Cytotec, plan to start pictocin, feels mild cxt, but tolerates well.  Patient Active Problem List   Diagnosis Date Noted  . Circumvallate placenta during pregnancy in third trimester, antepartum 06/28/2019  . Supervision of normal pregnancy, antepartum 01/07/2019  . Asymptomatic bacteriuria during pregnancy 12/09/2018  . Chlamydia infection affecting pregnancy 12/09/2018   NICHD: Category 1  Membranes:  Intact, no s/s of infection  Induction:    Cytotec x2, @  0100, 0500  Foley Bulb: inserted  No indicated  Pitocin - To start @ 2x2  Pain management:            IV pain management: PRN, 0             Epidural placement:  PRN  GBS Positive  Abx: x2 @ 4/8  Plan: Continue labor plan Continuous/intermittent monitoring Rest Ambulate Frequent position changes to facilitate fetal rotation and descent. Will reassess with cervical exam at 1400or earlier if necessary Start pitocin per protocol Epidural per pt request Baby Female: Out pt circ desired.  Anticipate labor progression and vaginal delivery.   [facility] aware of plan and verbalized agreement.   Noralyn Pick, NP-C, CNM, MSN 06/29/2019. 9:29 AM

## 2019-06-29 NOTE — Anesthesia Preprocedure Evaluation (Signed)

## 2019-06-29 NOTE — Anesthesia Procedure Notes (Signed)
Epidural Patient location during procedure: OB  Staffing Anesthesiologist: Casidy Alberta, MD Performed: anesthesiologist   Preanesthetic Checklist Completed: patient identified, site marked, surgical consent, pre-op evaluation, timeout performed, IV checked, risks and benefits discussed and monitors and equipment checked  Epidural Patient position: sitting Prep: DuraPrep Patient monitoring: heart rate, continuous pulse ox and blood pressure Approach: right paramedian Location: L3-L4 Injection technique: LOR saline  Needle:  Needle type: Tuohy  Needle gauge: 17 G Needle length: 9 cm and 9 Needle insertion depth: 6 cm Catheter type: closed end flexible Catheter size: 20 Guage Catheter at skin depth: 10 cm Test dose: negative  Assessment Events: blood not aspirated, injection not painful, no injection resistance, negative IV test and no paresthesia  Additional Notes Patient identified. Risks/Benefits/Options discussed with patient including but not limited to bleeding, infection, nerve damage, paralysis, failed block, incomplete pain control, headache, blood pressure changes, nausea, vomiting, reactions to medication both or allergic, itching and postpartum back pain. Confirmed with bedside nurse the patient's most recent platelet count. Confirmed with patient that they are not currently taking any anticoagulation, have any bleeding history or any family history of bleeding disorders. Patient expressed understanding and wished to proceed. All questions were answered. Sterile technique was used throughout the entire procedure. Please see nursing notes for vital signs. Test dose was given through epidural needle and negative prior to continuing to dose epidural or start infusion. Warning signs of high block given to the patient including shortness of breath, tingling/numbness in hands, complete motor block, or any concerning symptoms with instructions to call for help. Patient was given  instructions on fall risk and not to get out of bed. All questions and concerns addressed with instructions to call with any issues.     

## 2019-06-29 NOTE — Progress Notes (Signed)
Labor Progress Note  Kristina Barnett, 18 y.o., G1P0, with an IUP @ [redacted]w[redacted]d, presented for presented for IOL for circumvallate cord insertion.   Subjective: Pt resting in with BF at bedside and endorses starting to feel cxt. Discussed if no cervical change AROM, reviewed R/B/A, pt agreeable to AROM now and possible epidural when pain gets worse.  Patient Active Problem List   Diagnosis Date Noted  . Circumvallate placenta during pregnancy in third trimester, antepartum 06/28/2019  . Supervision of normal pregnancy, antepartum 01/07/2019  . Asymptomatic bacteriuria during pregnancy 12/09/2018  . Chlamydia infection affecting pregnancy 12/09/2018   Objective: BP 108/64   Pulse 64   Temp 98.1 F (36.7 C) (Oral)   Resp 16   Ht 5' 7.5" (1.715 m)   Wt 86.3 kg   LMP 09/21/2018   BMI 29.37 kg/m  No intake/output data recorded. No intake/output data recorded. NST: FHR baseline 120 bpm, Variability: moderate, Accelerations:present, Decelerations:  Absent= Cat 1/Reactive CTX:  none Uterus gravid, soft non tender, moderate to palpate with contractions.  SVE:  Dilation: 3 Effacement (%): 70 Station: -2 Exam by:: [facility], CNM Pitocin at (10)mUn/min  AROM, clear, small amount, fetus and pt tolerated well.   Assessment:  Kristina Barnett, 18 y.o., G1P0, with an IUP @ [redacted]w[redacted]d, presented for IOL for circumvallate cord insertion. Pt stable and progress on pitocin slow, feeling cxt after AROM.  Patient Active Problem List   Diagnosis Date Noted  . Circumvallate placenta during pregnancy in third trimester, antepartum 06/28/2019  . Supervision of normal pregnancy, antepartum 01/07/2019  . Asymptomatic bacteriuria during pregnancy 12/09/2018  . Chlamydia infection affecting pregnancy 12/09/2018   NICHD: Category 1  Membranes:  AROM clear, @ 1400 on 8/9, no s/s of infection  Induction:    Cytotec x2, @  0100, 0500  Foley Bulb: inserted  Never indicated  Pitocin - 10  Pain  management:               IV pain management: PRN, 0             Epidural placement:  PRN  GBS Positive  Abx: x2 @ 02/26/11  Plan: Continue labor plan Continuous monitoring Rest Ambulate Frequent position changes to facilitate fetal rotation and descent. Will reassess with cervical exam at 1900 or earlier if necessary Continue pitocin per protocol Epidural per pt request Baby Female: Out pt circ desired.  Anticipate labor progression and vaginal delivery.   Md Simona Huh to be updated PRN.    Noralyn Pick, NP-C, CNM, MSN 06/29/2019. 3:53 PM

## 2019-06-30 DIAGNOSIS — O9081 Anemia of the puerperium: Secondary | ICD-10-CM | POA: Diagnosis present

## 2019-06-30 LAB — CBC
HCT: 26.9 % — ABNORMAL LOW (ref 36.0–46.0)
Hemoglobin: 8.5 g/dL — ABNORMAL LOW (ref 12.0–15.0)
MCH: 26.1 pg (ref 26.0–34.0)
MCHC: 31.6 g/dL (ref 30.0–36.0)
MCV: 82.5 fL (ref 80.0–100.0)
Platelets: 181 10*3/uL (ref 150–400)
RBC: 3.26 MIL/uL — ABNORMAL LOW (ref 3.87–5.11)
RDW: 14.7 % (ref 11.5–15.5)
WBC: 7.9 10*3/uL (ref 4.0–10.5)
nRBC: 0 % (ref 0.0–0.2)

## 2019-06-30 MED ORDER — FERROUS SULFATE 325 (65 FE) MG PO TABS
325.0000 mg | ORAL_TABLET | Freq: Two times a day (BID) | ORAL | Status: DC
  Administered 2019-06-30 – 2019-07-01 (×3): 325 mg via ORAL
  Filled 2019-06-30 (×3): qty 1

## 2019-06-30 NOTE — Progress Notes (Signed)
Subjective: Postpartum Day # 1 : S/P NSVD due to IOL for circumvallate cord insertion. Patient up ad lib, denies syncope or dizziness. Reports consuming regular diet without issues and denies N/V. Patient reports 0 bowel movement + passing flatus.  Denies issues with urination and reports bleeding is "lighter."  Patient is bottle feeding and reports going well.  Desires undecided for postpartum contraception.  Pain is being appropriately managed with use of po meds. Pt endorses feeling fatigued.   3rd laceration Feeding:  bottle Contraceptive plan:  undecided BB: Circ out pt desired  Objective: Vital signs in last 24 hours: Patient Vitals for the past 24 hrs:  BP Temp Temp src Pulse Resp SpO2  06/30/19 0500 105/76 98.3 F (36.8 C) Oral 78 16 -  06/30/19 0130 109/68 98.1 F (36.7 C) Oral 94 20 99 %  06/29/19 2154 120/63 - - 63 - -  06/29/19 2136 121/75 98.6 F (37 C) Oral 90 18 -  06/29/19 2011 129/62 98.7 F (37.1 C) Oral 91 18 -  06/29/19 1942 - 98.5 F (36.9 C) Oral - 18 -  06/29/19 1902 (!) 98/50 - - 79 18 -  06/29/19 1845 102/76 - - 80 16 -  06/29/19 1832 91/75 - - 90 18 -  06/29/19 1731 117/77 - - 82 18 -  06/29/19 1701 (!) 99/56 - - 88 18 -  06/29/19 1631 (!) 115/54 - - 90 16 -  06/29/19 1601 129/67 - - 94 16 -  06/29/19 1531 108/64 98.1 F (36.7 C) Oral 64 16 -  06/29/19 1526 (!) 95/45 - - 85 16 -  06/29/19 1520 (!) 100/55 - - 76 16 -  06/29/19 1515 (!) 96/54 - - 83 16 -  06/29/19 1510 (!) 99/49 - - (!) 103 16 -  06/29/19 1505 112/67 - - 86 - -  06/29/19 1500 122/67 - - 85 16 -  06/29/19 1455 119/70 - - 89 16 -  06/29/19 1451 116/61 - - 76 18 -  06/29/19 1447 (!) 114/59 - - 79 16 -  06/29/19 1401 108/61 - - 86 16 -  06/29/19 1331 106/60 - - 86 18 -  06/29/19 1301 110/76 - - 85 18 -  06/29/19 1247 114/69 98.9 F (37.2 C) Oral 82 16 -  06/29/19 1151 107/65 - - 83 16 -  06/29/19 1107 (!) 100/56 - - 94 16 -  06/29/19 1035 (!) 112/54 - - 96 16 -  06/29/19 0949 (!)  116/58 - - 87 16 -  06/29/19 0804 117/68 - - 87 18 -     Physical Exam:  General: alert, cooperative, appears stated age and no distress Mood/Affect: happy Lungs: clear to auscultation, no wheezes, rales or rhonchi, symmetric air entry.  Heart: normal rate, regular rhythm, normal S1, S2, no murmurs, rubs, clicks or gallops. Breast: breasts appear normal, no suspicious masses, no skin or nipple changes or axillary nodes. Abdomen:  + bowel sounds, soft, non-tender GU: perineum aprroximate, healing well. No signs of external hematomas.  Uterine Fundus: firm Lochia: appropriate Skin: Warm, Dry. DVT Evaluation: No evidence of DVT seen on physical exam. Negative Homan's sign. No cords or calf tenderness. No significant calf/ankle edema.  CBC Latest Ref Rng & Units 06/30/2019 06/29/2019 01/07/2019  WBC 4.0 - 10.5 K/uL 7.9 6.0 7.1  Hemoglobin 12.0 - 15.0 g/dL 1.6(X8.5(L) 0.9(U9.5(L) 04.512.0  Hematocrit 36.0 - 46.0 % 26.9(L) 30.0(L) 35.0  Platelets 150 - 400 K/uL 181 212 245    Results  for orders placed or performed during the hospital encounter of 06/29/19 (from the past 24 hour(s))  CBC     Status: Abnormal   Collection Time: 06/30/19  4:52 AM  Result Value Ref Range   WBC 7.9 4.0 - 10.5 K/uL   RBC 3.26 (L) 3.87 - 5.11 MIL/uL   Hemoglobin 8.5 (L) 12.0 - 15.0 g/dL   HCT 26.9 (L) 36.0 - 46.0 %   MCV 82.5 80.0 - 100.0 fL   MCH 26.1 26.0 - 34.0 pg   MCHC 31.6 30.0 - 36.0 g/dL   RDW 14.7 11.5 - 15.5 %   Platelets 181 150 - 400 K/uL   nRBC 0.0 0.0 - 0.2 %     CBG (last 3)  No results for input(s): GLUCAP in the last 72 hours.   I/O last 3 completed shifts: In: 0  Out: 1780 [Urine:1125; Blood:655]   Assessment Postpartum Day # 1 : S/P NSVD due to IOL for circumvallate cord insertion. Pt stable. -1 involution. Bottle feeding. Hemodynamically stable with hgb drop from 9.5-8.5, mild symptomatic anemia with fatigue.   Plan: Continue other mgmt as ordered Anemia: Start Iron BID today and with  PNV Baby Female: Out pt circ desired.  VTE prophylactics: Early ambulated as tolerates.  Pain control: Motrin/Tylenol PRN Education given regarding options for contraception, including barrier methods, injectable contraception, IUD placement, oral contraceptives.  Plan for discharge tomorrow and Contraception undecided.   Dr. Mancel Bale to be updated on patient status  [facility] NP-C, CNM 06/30/2019, 7:03 AM

## 2019-06-30 NOTE — Anesthesia Postprocedure Evaluation (Signed)
Anesthesia Post Note  Patient: Kristina Barnett  Procedure(s) Performed: AN AD HOC LABOR EPIDURAL     Patient location during evaluation: Mother Baby Anesthesia Type: Epidural Level of consciousness: awake and alert Pain management: pain level controlled Vital Signs Assessment: post-procedure vital signs reviewed and stable Respiratory status: spontaneous breathing, nonlabored ventilation and respiratory function stable Cardiovascular status: stable Postop Assessment: no headache, no backache and epidural receding Anesthetic complications: no    Last Vitals:  Vitals:   06/30/19 0130 06/30/19 0500  BP: 109/68 105/76  Pulse: 94 78  Resp: 20 16  Temp: 36.7 C 36.8 C  SpO2: 99%     Last Pain:  Vitals:   06/30/19 0500  TempSrc: Oral  PainSc: 0-No pain   Pain Goal:                   Findley Vi

## 2019-06-30 NOTE — Discharge Summary (Signed)
SVD OB Discharge Summary     Patient Name: Kristina NapoleonCynterria C Broadwell DOB: 2001/01/01 MRN: [id number]  Date of admission: 06/29/2019 Delivering MD: Dale DurhamMONTANA, Alby Schwabe  Date of delivery: 06/29/2019 Type of delivery: SVD  Newborn Data: Sex: Baby female Circumcision: out pt desired Live born female  Birth Weight: 7 lb 5.8 oz (3340 g) APGAR: 9, 9  Newborn Delivery   Birth date/time: 06/29/2019 18:08:00 Delivery type: Vaginal, Spontaneous      Feeding: bottle Infant being discharge to home with mother in stable condition.   Admitting diagnosis: pregnancy Intrauterine pregnancy: 3822w1d     Secondary diagnosis:  Active Problems:   Circumvallate placenta during pregnancy in third trimester, antepartum   Postpartum anemia   Normal postpartum course                                Complications: None                                                              Intrapartum Procedures: spontaneous vaginal delivery and GBS prophylaxis Postpartum Procedures: none Complications-Operative and Postpartum: 3rd degree perineal laceration Augmentation: AROM, Pitocin and Cytotec   History of Present Illness: Ms. Kristina Barnett is a 18 y.o. female, G1P1001, who presents at 4522w1d weeks gestation. [facility]  Her pregnancy has been complicated by:  Patient Active Problem List   Diagnosis Date Noted  . Postpartum anemia 06/30/2019  . Normal postpartum course 06/30/2019  . Circumvallate placenta during pregnancy in third trimester, antepartum 06/28/2019  . Supervision of normal pregnancy, antepartum 01/07/2019  . Asymptomatic bacteriuria during pregnancy 12/09/2018  . Chlamydia infection affecting pregnancy 12/09/2018    Hospital course:  Induction of Labor With Vaginal Delivery   18 y.o. yo G1P1001 at 6722w1d was admitted to the hospital 06/29/2019 for induction of labor.  Indication for induction: curcumvallete cord insertion .  Patient  had an uncomplicated labor course as follows: Membrane Rupture Time/Date: 1:54 PM ,06/29/2019   Intrapartum Procedures: Episiotomy: Median [2]                                         Lacerations:  3rd degree [4]  Patient had delivery of a Viable infant.  Information for the patient's newborn:  Marcelino DusterWilliams, Boy Amisadai [[id number]][[id number]]  Delivery Method: Vag-Spont    06/29/2019  Details of delivery can be found in separate delivery note.  Patient had a routine postpartum course. Patient is discharged home 07/01/19. Postpartum Day # 2 : S/P NSVD due to circumvallate cord insertion . Patient up ad lib, denies syncope or dizziness. Reports consuming regular diet without issues and denies N/V. Patient reports 0 bowel movement + passing flatus.  Denies issues with urination and reports bleeding is "lighter."  Patient is bottle feeding and reports going well.  Desires undecided for postpartum contraception.  Pain is being appropriately managed with use of po meds. Pt hgb dropped from 9.5-8.5, endorses mild symptoms of fatigue, no other s/sx of anemia. Pt stable and ready to be discharged.    Physical exam  Vitals:  06/30/19 0130 06/30/19 0500 06/30/19 1058 06/30/19 2217  BP: 109/68 105/76 113/69 115/63  Pulse: 94 78 93 96  Resp: 20 16 18 18   Temp: 98.1 F (36.7 C) 98.3 F (36.8 C) 98.3 F (36.8 C) 99.4 F (37.4 C)  TempSrc: Oral Oral Oral Oral  SpO2: 99%     Weight:      Height:       General: alert, cooperative and no distress Lochia: appropriate Uterine Fundus: firm Perineum: approximate, no hemtomas noted DVT Evaluation: No evidence of DVT seen on physical exam. Negative Homan's sign. No cords or calf tenderness. No significant calf/ankle edema.  Labs: Lab Results  Component Value Date   WBC 7.9 06/30/2019   HGB 8.5 (L) 06/30/2019   HCT 26.9 (L) 06/30/2019   MCV 82.5 06/30/2019   PLT 181 06/30/2019   No flowsheet data found.  Date of discharge: 07/01/2019 Discharge Diagnoses: Term  Pregnancy-delivered Discharge instruction: per After Visit Summary and "Baby and Me Booklet".  After visit meds:   Activity:           unrestricted and pelvic rest Advance as tolerated. Pelvic rest for 6 weeks.  Diet:                routine Medications: PNV, Ibuprofen, Colace and Iron Postpartum contraception: Undecided Condition:  Pt discharge to home with baby in stable Anemia: Iron BID with PNV daily.  Baby Female: Out pt circ.   Meds: Iron, PNV, motrin, colace. Please read labels for directions.   Discharge Follow Up:  [facility] Follow up in 6 day(s).   Specialty: Obstetrics and Gynecology Why: make 1 week out pt baby female circ appointment and 6 weeks PPV.  Contact information: [place] [place] [address] [place], NP-C, CNM 07/01/2019, 12:17 AM  Noralyn Pick, [place]

## 2019-07-01 MED ORDER — FERROUS SULFATE 325 (65 FE) MG PO TABS: 325.0000 mg | ORAL_TABLET | Freq: Two times a day (BID) | ORAL | 3 refills | Status: DC

## 2019-07-01 MED ORDER — IBUPROFEN 100 MG/5ML PO SUSP: 600.0000 mg | Freq: Four times a day (QID) | ORAL | 0 refills | Status: AC

## 2019-08-06 ENCOUNTER — Other Ambulatory Visit: Payer: Self-pay

## 2019-08-06 DIAGNOSIS — Z20822 Contact with and (suspected) exposure to covid-19: Secondary | ICD-10-CM

## 2019-08-07 LAB — NOVEL CORONAVIRUS, NAA: SARS-CoV-2, NAA: NOT DETECTED

## 2019-09-15 ENCOUNTER — Other Ambulatory Visit: Payer: Self-pay

## 2019-09-15 ENCOUNTER — Encounter (HOSPITAL_COMMUNITY): Payer: Self-pay | Admitting: Emergency Medicine

## 2019-09-15 ENCOUNTER — Emergency Department (HOSPITAL_COMMUNITY)
Admission: EM | Admit: 2019-09-15 | Discharge: 2019-09-15 | Disposition: A | Discharge status: Home / Self Care | Payer: Medicaid Other | Attending: Emergency Medicine | Admitting: Emergency Medicine

## 2019-09-15 ENCOUNTER — Emergency Department (HOSPITAL_COMMUNITY): Payer: Medicaid Other

## 2019-09-15 DIAGNOSIS — M542 Cervicalgia: Secondary | ICD-10-CM | POA: Insufficient documentation

## 2019-09-15 DIAGNOSIS — Z79899 Other long term (current) drug therapy: Secondary | ICD-10-CM | POA: Diagnosis not present

## 2019-09-15 DIAGNOSIS — M549 Dorsalgia, unspecified: Secondary | ICD-10-CM | POA: Insufficient documentation

## 2019-09-15 LAB — POC URINE PREG, ED: Preg Test, Ur: NEGATIVE

## 2019-09-15 MED ORDER — NAPROXEN 375 MG PO TABS: 375.0000 mg | ORAL_TABLET | Freq: Two times a day (BID) | ORAL | 0 refills | Status: DC

## 2019-09-15 MED ORDER — METHOCARBAMOL 500 MG PO TABS: 500.0000 mg | ORAL_TABLET | Freq: Two times a day (BID) | ORAL | 0 refills | Status: DC
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
## 2019-09-15 NOTE — Discharge Instructions (Signed)
Get help right away if: °You have: °Numbness, tingling, or weakness in your arms or legs. °Severe neck pain, especially tenderness in the middle of the back of your neck. °Changes in bowel or bladder control. °Increasing pain in any area of your body. °Swelling in any area of your body, especially your legs. °Shortness of breath or light-headedness. °Chest pain. °Blood in your urine, stool, or vomit. °Severe pain in your abdomen or your back. °Severe or worsening headaches. °Sudden vision loss or double vision. °Your eye suddenly becomes red. °Your pupil is an odd shape or size. °

## 2020-10-18 ENCOUNTER — Encounter (HOSPITAL_COMMUNITY): Payer: Self-pay

## 2020-10-18 ENCOUNTER — Emergency Department (HOSPITAL_COMMUNITY)
Admission: EM | Admit: 2020-10-18 | Discharge: 2020-10-19 | Disposition: A | Discharge status: Home / Self Care | Payer: Self-pay | Attending: Emergency Medicine | Admitting: Emergency Medicine

## 2020-10-18 ENCOUNTER — Other Ambulatory Visit: Payer: Self-pay

## 2020-10-18 DIAGNOSIS — O23591 Infection of other part of genital tract in pregnancy, first trimester: Secondary | ICD-10-CM | POA: Insufficient documentation

## 2020-10-18 DIAGNOSIS — O209 Hemorrhage in early pregnancy, unspecified: Secondary | ICD-10-CM

## 2020-10-18 DIAGNOSIS — O23599 Infection of other part of genital tract in pregnancy, unspecified trimester: Secondary | ICD-10-CM

## 2020-10-18 DIAGNOSIS — N390 Urinary tract infection, site not specified: Secondary | ICD-10-CM

## 2020-10-18 DIAGNOSIS — Z3A00 Weeks of gestation of pregnancy not specified: Secondary | ICD-10-CM | POA: Insufficient documentation

## 2020-10-18 DIAGNOSIS — N939 Abnormal uterine and vaginal bleeding, unspecified: Secondary | ICD-10-CM

## 2020-10-18 DIAGNOSIS — B9689 Other specified bacterial agents as the cause of diseases classified elsewhere: Secondary | ICD-10-CM

## 2020-10-18 LAB — PREGNANCY, URINE: Preg Test, Ur: NEGATIVE

## 2020-10-18 NOTE — ED Triage Notes (Signed)
Patient arrived stating she found out she was pregnant yesterday. Reports tonight abdominal pain and nausea. States she passed some clots earlier today but has since stopped.

## 2020-10-18 NOTE — ED Provider Notes (Signed)
[facility]-EMERGENCY DEPT Provider Note   CSN: [id number] Arrival date & time: 10/18/20  2147    History Chief Complaint  Patient presents with  . Threatened Miscarriage    Kristina Barnett is a 19 y.o. female.  The history is provided by the patient.  She had a positive pregnancy test recently, last menses was beginning of this month.  She has had nausea for the last 2 days and some breast tenderness.  She is gravida 2, para one.  She has been having some lower abdominal cramping.  She rates pain at 6/10.  Earlier today, she had some vaginal spotting which is what prompted her to come to the emergency department.  She states that the bleeding has stopped.  History reviewed. No pertinent past medical history.  Patient Active Problem List   Diagnosis Date Noted  . Postpartum anemia 06/30/2019  . Normal postpartum course 06/30/2019  . Circumvallate placenta during pregnancy in third trimester, antepartum 06/28/2019  . Supervision of normal pregnancy, antepartum 01/07/2019  . Asymptomatic bacteriuria during pregnancy 12/09/2018  . Chlamydia infection affecting pregnancy 12/09/2018    History reviewed. No pertinent surgical history.   OB History    Gravida  2   Para  1   Term  1   Preterm      AB      Living  1     SAB      TAB      Ectopic      Multiple  0   Live Births  1           No family history on file.  Social History   Tobacco Use  . Smoking status: Never Smoker  . Smokeless tobacco: Never Used  Vaping Use  . Vaping Use: Never used  Substance Use Topics  . Alcohol use: No  . Drug use: No    Home Medications Prior to Admission medications   Medication Sig Start Date End Date Taking? Authorizing Provider  ferrous sulfate 325 (65 FE) MG tablet Take 1 tablet (325 mg total) by mouth 2 (two) times daily with a meal. 07/01/19   Dillard, Naima, MD  methocarbamol (ROBAXIN) 500 MG tablet Take 1 tablet (500 mg total)  by mouth 2 (two) times daily. 09/15/19   Harris, Cammy Copa, PA-C  naproxen (NAPROSYN) 375 MG tablet Take 1 tablet (375 mg total) by mouth 2 (two) times daily. 09/15/19   Arthor Captain, PA-C  Prenatal Vit-Fe Fumarate-FA (PRENATAL VITAMIN) 27-0.8 MG TABS Take 1 tablet by mouth daily. Patient not taking: Reported on 01/07/2019 12/06/18   Tamera Stands, DO    Allergies    Patient has no known allergies.  Review of Systems   Review of Systems  All other systems reviewed and are negative.   Physical Exam Updated Vital Signs BP 131/78 (BP Location: Left Arm)   Pulse 97   Temp 100 F (37.8 C) (Oral)   Resp 17   Ht 5\' 7"  (1.702 m)   Wt 77.1 kg   SpO2 100%   BMI 26.63 kg/m   Physical Exam Vitals and nursing note reviewed.   19 year old female, resting comfortably and in no acute distress. Vital signs are normal. Oxygen saturation is 100%, which is normal. Head is normocephalic and atraumatic. PERRLA, EOMI. Oropharynx is clear. Neck is nontender and supple without adenopathy or JVD. Back is nontender and there is no CVA tenderness. Lungs are clear without rales, wheezes, or rhonchi. Chest  is nontender. Heart has regular rate and rhythm without murmur. Abdomen is soft, flat, nontender without masses or hepatosplenomegaly and peristalsis is hypoactive. Pelvic: Normal external female genitalia.  Speculum exam was suboptimal due to inability to insert the speculum.  On manual exam, there is a firm density posterior to the vaginal introitus which limits entry.  There is no evidence of any vaginal bleeding, but I cannot assess the cervix or uterine size. Extremities have no cyanosis or edema, full range of motion is present. Skin is warm and dry without rash. Neurologic: Mental status is normal, cranial nerves are intact, there are no motor or sensory deficits.  ED Results / Procedures / Treatments   Labs (all labs ordered are listed, but only abnormal results are displayed) Labs  Reviewed  WET PREP, GENITAL - Abnormal; Notable for the following components:      Result Value   Clue Cells Wet Prep HPF POC PRESENT (*)    All other components within normal limits  BASIC METABOLIC PANEL - Abnormal; Notable for the following components:   Potassium 3.4 (*)    CO2 20 (*)    All other components within normal limits  CBC WITH DIFFERENTIAL/PLATELET - Abnormal; Notable for the following components:   Hemoglobin 9.0 (*)    HCT 31.2 (*)    MCV 70.4 (*)    MCH 20.3 (*)    MCHC 28.8 (*)    RDW 18.5 (*)    All other components within normal limits  HCG, QUANTITATIVE, PREGNANCY - Abnormal; Notable for the following components:   hCG, Beta Chain, Quant, S 48 (*)    All other components within normal limits  URINALYSIS, ROUTINE W REFLEX MICROSCOPIC - Abnormal; Notable for the following components:   Color, Urine AMBER (*)    APPearance TURBID (*)    Ketones, ur 5 (*)    Protein, ur 100 (*)    Nitrite POSITIVE (*)    Leukocytes,Ua LARGE (*)    WBC, UA >50 (*)    Bacteria, UA RARE (*)    Squamous Epithelial / LPF >50 (*)    Non Squamous Epithelial 0-5 (*)    All other components within normal limits  URINE CULTURE  PREGNANCY, URINE  RPR  HIV ANTIBODY (ROUTINE TESTING W REFLEX)  ABO/RH  GC/CHLAMYDIA PROBE AMP ([place]) NOT AT [facility]   Radiology US OB LESS THAN 14 WEEKS WITH OB TRANSVAGINAL  Result Date: 10/19/2020 CLINICAL DATA:  Vaginal bleeding, positive pregnancy test EXAM: OBSTETRIC <14 WK [place] AND TRANSVAGINAL OB US TECHNIQUE: Both transabdominal and transvaginal ultrasound examinations were performed for complete evaluation of the gestation as well as the maternal uterus, adnexal regions, and pelvic cul-de-sac. Transvaginal technique was performed to assess early pregnancy. COMPARISON:  None. FINDINGS: Intrauterine gestational sac: Absent Maternal uterus/adnexae: Uterus is well visualized and within normal limits with the exception of prominent endometrium at 3  cm. Ovaries are well visualized. A 2.1 cm cystic lesion is noted likely representing a prominent follicle. Is noted within the right ovary simple in nature. IMPRESSION: No intra or definitive extra uterine pregnancy is noted. Beta HCG level is extremely low. Continued follow-up of beta HCG is recommended. Repeat imaging can be performed as clinically necessary. Electronically Signed   By: Alcide Clever M.D.   On: 10/19/2020 02:13    Procedures Procedures  Medications Ordered in ED Medications  cephALEXin (KEFLEX) capsule 500 mg (has no administration in time range)    ED Course  I have reviewed  the triage vital signs and the nursing notes.  Pertinent labs & imaging results that were available during my care of the patient were reviewed by me and considered in my medical decision making (see chart for details).  MDM Rules/Calculators/A&P First trimester bleeding.  Old records are reviewed confirming prior pregnancy, Rh+ status.  Will check pelvic ultrasound.  Baseline hCG is ordered.  Ultrasound shows no evidence of IUP, but hCG has come back and only 48.  This indicates very early pregnancy which is consistent with LMP about 4 weeks ago.  Urinalysis does show signs of infection.  Wet prep does show bacterial vaginosis, will defer treatment decisions to her obstetrician.  Patient is referred to her obstetrician for repeat hCG in 48-72hours.  She is discharged with prescription for cephalexin and she is started back on prenatal vitamins.  Final Clinical Impression(s) / ED Diagnoses Final diagnoses:  First trimester bleeding  Bacterial vaginosis in pregnancy  Urinary tract infection without hematuria, site unspecified    Rx / DC Orders ED Discharge Orders         Ordered    Prenatal Vit-Fe Fumarate-FA (PRENATAL VITAMIN) 27-0.8 MG TABS  Daily        10/19/20 0243    cephALEXin (KEFLEX) 500 MG capsule  3 times daily        10/19/20 0243           Dione Booze, MD 10/19/20 0246

## 2020-10-19 ENCOUNTER — Emergency Department (HOSPITAL_COMMUNITY): Payer: Self-pay

## 2020-10-19 LAB — CBC WITH DIFFERENTIAL/PLATELET
Abs Immature Granulocytes: 0.02 10*3/uL (ref 0.00–0.07)
Basophils Absolute: 0 10*3/uL (ref 0.0–0.1)
Basophils Relative: 1 %
Eosinophils Absolute: 0.1 10*3/uL (ref 0.0–0.5)
Eosinophils Relative: 1 %
HCT: 31.2 % — ABNORMAL LOW (ref 36.0–46.0)
Hemoglobin: 9 g/dL — ABNORMAL LOW (ref 12.0–15.0)
Immature Granulocytes: 0 %
Lymphocytes Relative: 34 %
Lymphs Abs: 2.2 10*3/uL (ref 0.7–4.0)
MCH: 20.3 pg — ABNORMAL LOW (ref 26.0–34.0)
MCHC: 28.8 g/dL — ABNORMAL LOW (ref 30.0–36.0)
MCV: 70.4 fL — ABNORMAL LOW (ref 80.0–100.0)
Monocytes Absolute: 0.7 10*3/uL (ref 0.1–1.0)
Monocytes Relative: 11 %
Neutro Abs: 3.4 10*3/uL (ref 1.7–7.7)
Neutrophils Relative %: 53 %
Platelets: 284 10*3/uL (ref 150–400)
RBC: 4.43 MIL/uL (ref 3.87–5.11)
RDW: 18.5 % — ABNORMAL HIGH (ref 11.5–15.5)
WBC: 6.3 10*3/uL (ref 4.0–10.5)
nRBC: 0 % (ref 0.0–0.2)

## 2020-10-19 LAB — URINALYSIS, ROUTINE W REFLEX MICROSCOPIC
Bilirubin Urine: NEGATIVE
Glucose, UA: NEGATIVE mg/dL
Hgb urine dipstick: NEGATIVE
Ketones, ur: 5 mg/dL — AB
Nitrite: POSITIVE — AB
Protein, ur: 100 mg/dL — AB
Specific Gravity, Urine: 1.025 (ref 1.005–1.030)
Squamous Epithelial / LPF: >50 — ABNORMAL HIGH (ref 0–5)
WBC, UA: >50 WBC/hpf — ABNORMAL HIGH (ref 0–5)
pH: 5 (ref 5.0–8.0)

## 2020-10-19 LAB — GC/CHLAMYDIA PROBE AMP (~~LOC~~) NOT AT ARMC
Chlamydia: NEGATIVE
Comment: NEGATIVE
Comment: NORMAL
Neisseria Gonorrhea: NEGATIVE

## 2020-10-19 LAB — HIV ANTIBODY (ROUTINE TESTING W REFLEX): HIV Screen 4th Generation wRfx: NONREACTIVE

## 2020-10-19 LAB — WET PREP, GENITAL
Sperm: NONE SEEN
Trich, Wet Prep: NONE SEEN
WBC, Wet Prep HPF POC: NONE SEEN
Yeast Wet Prep HPF POC: NONE SEEN

## 2020-10-19 LAB — BASIC METABOLIC PANEL
Anion gap: 10 (ref 5–15)
BUN: 10 mg/dL (ref 6–20)
CO2: 20 mmol/L — ABNORMAL LOW (ref 22–32)
Calcium: 9.5 mg/dL (ref 8.9–10.3)
Chloride: 105 mmol/L (ref 98–111)
Creatinine, Ser: 0.7 mg/dL (ref 0.44–1.00)
GFR, Estimated: >60 mL/min (ref >60)
Glucose, Bld: 90 mg/dL (ref 70–99)
Potassium: 3.4 mmol/L — ABNORMAL LOW (ref 3.5–5.1)
Sodium: 135 mmol/L (ref 135–145)

## 2020-10-19 LAB — HCG, QUANTITATIVE, PREGNANCY: hCG, Beta Chain, Quant, S: 48 m[IU]/mL — ABNORMAL HIGH (ref <5)

## 2020-10-19 LAB — RPR: RPR Ser Ql: NONREACTIVE

## 2020-10-19 LAB — ABO/RH: ABO/RH(D): O POS

## 2020-10-19 MED ORDER — CEPHALEXIN 500 MG PO CAPS: 500.0000 mg | ORAL_CAPSULE | Freq: Three times a day (TID) | ORAL | 0 refills | Status: DC

## 2020-10-19 MED ORDER — CEPHALEXIN 500 MG PO CAPS
500.0000 mg | ORAL_CAPSULE | Freq: Once | ORAL | Status: DC
  Filled 2020-10-19: qty 1

## 2020-10-19 MED ORDER — PRENATAL VITAMIN 27-0.8 MG PO TABS: 1.0000 | ORAL_TABLET | Freq: Every day | ORAL | 0 refills | Status: AC

## 2020-10-19 MED ORDER — CEPHALEXIN 250 MG/5ML PO SUSR: 500.0000 mg | Freq: Three times a day (TID) | ORAL | 0 refills | Status: AC

## 2020-10-19 NOTE — Discharge Instructions (Signed)
Return if you are having any problems.  Your obstetrician will decide when and how to treat bacterial vaginosis.

## 2020-10-21 LAB — URINE CULTURE: Culture: >=100000 — AB

## 2020-10-22 ENCOUNTER — Telehealth: Payer: Self-pay | Admitting: *Deleted

## 2020-10-22 NOTE — Telephone Encounter (Signed)
Post ED Visit - Positive Culture Follow-up  Culture report reviewed by antimicrobial stewardship pharmacist: [facility] []  , Pharm.D. []  Enzo Bi, .D., BCPS AQ-ID []  Celedonio Miyamoto, Pharm.D., BCPS []  [address], Pharm.D., BCPS []  [place], Garvin Fila.D., BCPS, AAHIVP []  , Pharm.D., BCPS, AAHIVP []  Georgina Pillion, PharmD, BCPS []  , PharmD, BCPS []  [place], PharmD, BCPS []  [address], PharmD []  , PharmD, BCPS []  Estella Husk, PharmD  Pharmacy Team []  Lysle Pearl, PharmD []  , PharmD []  Phillips Climes, PharmD []  , Rph []  Agapito Games) , PharmD []  Verlan Friends, PharmD []  , PharmD []  Mervyn Gay, PharmD []  , PharmD []  Vinnie Level, PharmD []  [facility], PharmD []  , PharmD []  Len Childs, PharmD   Positive urine culture Treated with Cephalexin, organism sensitive to the same and no further patient follow-up is required at this time.  [facility] 10/22/2020, 1:15 PM

## 2021-07-11 ENCOUNTER — Encounter (HOSPITAL_COMMUNITY): Payer: Self-pay

## 2021-07-11 ENCOUNTER — Emergency Department (HOSPITAL_COMMUNITY)
Admission: EM | Admit: 2021-07-11 | Discharge: 2021-07-11 | Disposition: A | Discharge status: Home / Self Care | Payer: Medicaid Other | Attending: Emergency Medicine | Admitting: Emergency Medicine

## 2021-07-11 ENCOUNTER — Other Ambulatory Visit: Payer: Self-pay

## 2021-07-11 ENCOUNTER — Emergency Department (HOSPITAL_COMMUNITY): Payer: Medicaid Other

## 2021-07-11 DIAGNOSIS — M24411 Recurrent dislocation, right shoulder: Secondary | ICD-10-CM | POA: Diagnosis not present

## 2021-07-11 DIAGNOSIS — M25511 Pain in right shoulder: Secondary | ICD-10-CM | POA: Diagnosis present

## 2021-07-11 NOTE — ED Provider Notes (Signed)
[facility] EMERGENCY DEPARTMENT Provider Note   CSN: [id number] Arrival date & time: 07/11/21  1444     History No chief complaint on file.   Kristina Barnett is a 20 y.o. female.  HPI     20 year old female comes in with chief complaint of shoulder pain.  She has history of right-sided shoulder dislocation.  About 2 days ago, she said she was getting out of her bed, put weight on her stretched right extremity and her shoulder popped out and then reduced by itself.  Since then she has no increased pain, but has been having some tightness in the scapular region.  She is concerned that the shoulder might still be dislocated and has not been using it.  History reviewed. No pertinent past medical history.  Patient Active Problem List   Diagnosis Date Noted   Postpartum anemia 06/30/2019   Normal postpartum course 06/30/2019   Circumvallate placenta during pregnancy in third trimester, antepartum 06/28/2019   Supervision of normal pregnancy, antepartum 01/07/2019   Asymptomatic bacteriuria during pregnancy 12/09/2018   Chlamydia infection affecting pregnancy 12/09/2018    History reviewed. No pertinent surgical history.   OB History     Gravida  2   Para  1   Term  1   Preterm      AB      Living  1      SAB      IAB      Ectopic      Multiple  0   Live Births  1           No family history on file.  Social History   Tobacco Use   Smoking status: Never   Smokeless tobacco: Never  Vaping Use   Vaping Use: Never used  Substance Use Topics   Alcohol use: No   Drug use: No    Home Medications Prior to Admission medications   Medication Sig Start Date End Date Taking? Authorizing Provider  Prenatal Vit-Fe Fumarate-FA (PRENATAL VITAMIN) 27-0.8 MG TABS Take 1 tablet by mouth daily. 10/19/20   Dione Booze, MD  ferrous sulfate 325 (65 FE) MG tablet Take 1 tablet (325 mg total) by mouth 2 (two) times daily with a  meal. Patient not taking: Reported on 10/19/2020 07/01/19 10/19/20  Jaymes Graff, MD    Allergies    Patient has no known allergies.  Review of Systems   Review of Systems  Constitutional:  Positive for activity change.  Musculoskeletal:  Positive for arthralgias.   Physical Exam Updated Vital Signs BP 101/74 (BP Location: Left Arm)   Pulse (!) 59   Temp 99 F (37.2 C) (Oral)   Resp 16   SpO2 94%   Breastfeeding Unknown   Physical Exam Vitals and nursing note reviewed.  Constitutional:      Appearance: She is well-developed.  HENT:     Head: Atraumatic.  Cardiovascular:     Rate and Rhythm: Normal rate.  Pulmonary:     Effort: Pulmonary effort is normal.  Musculoskeletal:        General: No tenderness.     Cervical back: Normal range of motion and neck supple.     Comments: Able to abduct and allow for passive rotation of the shoulder without any difficulty  Skin:    General: Skin is warm and dry.  Neurological:     Mental Status: She is alert and oriented to person, place, and time.  ED Results / Procedures / Treatments   Labs (all labs ordered are listed, but only abnormal results are displayed) Labs Reviewed - No data to display  EKG None  Radiology DG Shoulder Right  Result Date: 07/11/2021 CLINICAL DATA:  Pain. Patient reports right shoulder came out of place on Sunday. EXAM: RIGHT SHOULDER - 2+ VIEW COMPARISON:  None. FINDINGS: The glenohumeral joint appears located. No fracture identified. There is no evidence of arthropathy or other focal bone abnormality. Soft tissues are unremarkable. IMPRESSION: Negative. Electronically Signed   By: Signa Kell M.D.   On: 07/11/2021 16:04    Procedures Procedures   Medications Ordered in ED Medications - No data to display  ED Course  I have reviewed the triage vital signs and the nursing notes.  Pertinent labs & imaging results that were available during my care of the patient were reviewed by me and  considered in my medical decision making (see chart for details).    MDM Rules/Calculators/A&P                           Patient with history of right-sided shoulder dislocation comes in with chief complaint of recurrence of it.  The insult occurred 2 days ago.  X-ray confirms that the shoulder is now reduced.  She is actually having no pain, just scapular discomfort which is likely because of spasms, as she has not been using her right shoulder.  Advised RICE treatment and advised no lifting weights for now until seen and cleared by orthopedist.  We will not put her in sling as she is symptom-free and I do not want to freeze her shoulder.  Final Clinical Impression(s) / ED Diagnoses Final diagnoses:  Recurrent shoulder dislocation, right    Rx / DC Orders ED Discharge Orders     None        Derwood Kaplan, MD 07/11/21 2238

## 2021-07-11 NOTE — Discharge Instructions (Addendum)
You likely have shoulder instability leading to recurrent dislocation  At this time, the shoulder is in place.  You likely have spasms in the back, causing more discomfort.  Do not lift anything more than 5 pounds until seen by orthopedist.  Avoid any kind of lifting above the shoulder/over your head.  Follow-up with the orthopedist in 7 to 10 days.

## 2021-07-11 NOTE — ED Triage Notes (Signed)
Patient complains of right shoulder coming out of place on Saturday. States she thinks back in place but something not right due to limited ROM

## 2021-07-11 NOTE — ED Provider Notes (Signed)
Emergency Medicine Provider Triage Evaluation Note  Kristina Barnett , a 20 y.o. female  was evaluated in triage.  Pt complains of ongoing right shoulder pain.  She states that this is the third time she has dislocated her shoulder.  She states that on Saturday she was using her right arm to push herself up on the bed and felt her shoulder pop out.  She states that it feels like it is partially back in.  She is able to move her shoulder however it still hurts and she states that it looks different than the other shoulder. She denies any other injuries, no numbness or weakness.    Review of Systems  Positive: Right shoulder pain Negative: Numbness, weakness  Physical Exam  BP 116/78 (BP Location: Left Arm)   Pulse 91   Temp 99 F (37.2 C) (Oral)   Resp 18   SpO2 100%  Gen:   Awake, no distress   Resp:  Normal effort  MSK:   Pain with movements on right shoulder.  She is unable to lift right arm above her head. Other:  2+ right radial pulse.  Sensation intact to light touch to right arm  Medical Decision Making  Medically screening exam initiated at 3:11 PM.  Appropriate orders placed.  [name] was informed that the remainder of the evaluation will be completed by another provider, this initial triage assessment does not replace that evaluation, and the importance of remaining in the ED until their evaluation is complete. Note: Portions of this report may have been transcribed using voice recognition software. Every effort was made to ensure accuracy; however, inadvertent computerized transcription errors may be present      Kristina Gong, PA-C 07/11/21 1513    Tegeler, Canary Brim, MD 07/11/21 [phone]

## 2021-07-18 ENCOUNTER — Encounter (HOSPITAL_COMMUNITY): Payer: Self-pay

## 2021-07-18 ENCOUNTER — Other Ambulatory Visit: Payer: Self-pay

## 2021-07-18 ENCOUNTER — Emergency Department (HOSPITAL_COMMUNITY): Payer: Medicaid Other

## 2021-07-18 ENCOUNTER — Emergency Department (HOSPITAL_COMMUNITY)
Admission: EM | Admit: 2021-07-18 | Discharge: 2021-07-18 | Disposition: A | Discharge status: Home / Self Care | Payer: Medicaid Other | Attending: Emergency Medicine | Admitting: Emergency Medicine

## 2021-07-18 DIAGNOSIS — K5900 Constipation, unspecified: Secondary | ICD-10-CM | POA: Diagnosis present

## 2021-07-18 DIAGNOSIS — K5909 Other constipation: Secondary | ICD-10-CM | POA: Diagnosis not present

## 2021-07-18 LAB — COMPREHENSIVE METABOLIC PANEL
ALT: 12 U/L (ref 0–44)
AST: 21 U/L (ref 15–41)
Albumin: 3.8 g/dL (ref 3.5–5.0)
Alkaline Phosphatase: 42 U/L (ref 38–126)
Anion gap: 8 (ref 5–15)
BUN: 7 mg/dL (ref 6–20)
CO2: 23 mmol/L (ref 22–32)
Calcium: 9.3 mg/dL (ref 8.9–10.3)
Chloride: 105 mmol/L (ref 98–111)
Creatinine, Ser: 0.73 mg/dL (ref 0.44–1.00)
GFR, Estimated: >60 mL/min (ref >60)
Glucose, Bld: 95 mg/dL (ref 70–99)
Potassium: 3.5 mmol/L (ref 3.5–5.1)
Sodium: 136 mmol/L (ref 135–145)
Total Bilirubin: 0.8 mg/dL (ref 0.3–1.2)
Total Protein: 8 g/dL (ref 6.5–8.1)

## 2021-07-18 LAB — I-STAT BETA HCG BLOOD, ED (MC, WL, AP ONLY): I-stat hCG, quantitative: 11.3 m[IU]/mL — ABNORMAL HIGH (ref <5)

## 2021-07-18 LAB — CBC WITH DIFFERENTIAL/PLATELET
Abs Immature Granulocytes: 0.02 10*3/uL (ref 0.00–0.07)
Basophils Absolute: 0 10*3/uL (ref 0.0–0.1)
Basophils Relative: 1 %
Eosinophils Absolute: 0.1 10*3/uL (ref 0.0–0.5)
Eosinophils Relative: 2 %
HCT: 34.8 % — ABNORMAL LOW (ref 36.0–46.0)
Hemoglobin: 9.8 g/dL — ABNORMAL LOW (ref 12.0–15.0)
Immature Granulocytes: 0 %
Lymphocytes Relative: 27 %
Lymphs Abs: 1.7 10*3/uL (ref 0.7–4.0)
MCH: 20.4 pg — ABNORMAL LOW (ref 26.0–34.0)
MCHC: 28.2 g/dL — ABNORMAL LOW (ref 30.0–36.0)
MCV: 72.3 fL — ABNORMAL LOW (ref 80.0–100.0)
Monocytes Absolute: 0.5 10*3/uL (ref 0.1–1.0)
Monocytes Relative: 8 %
Neutro Abs: 4 10*3/uL (ref 1.7–7.7)
Neutrophils Relative %: 62 %
Platelets: 415 10*3/uL — ABNORMAL HIGH (ref 150–400)
RBC: 4.81 MIL/uL (ref 3.87–5.11)
RDW: 19.3 % — ABNORMAL HIGH (ref 11.5–15.5)
WBC: 6.3 10*3/uL (ref 4.0–10.5)
nRBC: 0 % (ref 0.0–0.2)

## 2021-07-18 LAB — HCG, QUANTITATIVE, PREGNANCY: hCG, Beta Chain, Quant, S: 4 m[IU]/mL (ref <5)

## 2021-07-18 MED ORDER — SORBITOL 70 % SOLN
960.0000 mL | TOPICAL_OIL | Freq: Once | ORAL | Status: AC
  Administered 2021-07-18: 960 mL via RECTAL
  Filled 2021-07-18: qty 473

## 2021-07-18 MED ORDER — LIDOCAINE HCL URETHRAL/MUCOSAL 2 % EX GEL
1.0000 "application " | Freq: Once | CUTANEOUS | Status: AC
  Administered 2021-07-18: 1 via TOPICAL
  Filled 2021-07-18: qty 11

## 2021-07-18 MED ORDER — POLYETHYLENE GLYCOL 3350 17 GM/SCOOP PO POWD: 17.0000 g | Freq: Every day | ORAL | 0 refills | Status: DC

## 2021-07-18 NOTE — ED Provider Notes (Addendum)
Emergency Medicine Provider Triage Evaluation Note  Kristina Barnett , a 20 y.o. female  was evaluated in triage.  Patient presents with complaints of constipation, has not had a normal BM in 2 weeks, passed very small hard stool with a bit of blood about 1 week ago. Having associated abdominal & rectal discomfort. Recently started iron tablets. Passing gas without difficulty.   Review of Systems  Positive: Constipation, abdominal discomfort.  Negative: N/V, fever, dysuria, vaginal bleeding  Physical Exam  BP 108/69   Pulse (!) 103   Temp 98.6 F (37 C) (Oral)   Resp 16   SpO2 100%  Gen:   Awake, no distress   Resp:  Normal effort  MSK:   Moves extremities without difficulty  Other:  No peritoneal signs on abdominal exam.  Medical Decision Making  Medically screening exam initiated at 8:32 AM.  Appropriate orders placed.  MATTALYN ANDEREGG was informed that the remainder of the evaluation will be completed by another provider, this initial triage assessment does not replace that evaluation, and the importance of remaining in the ED until their evaluation is complete.  Constipation.    Cherly Anderson, PA-C 07/18/21 0835    Cherly Anderson, PA-C 07/18/21 [id number]    Wynetta Fines, MD 07/19/21 1014

## 2021-07-18 NOTE — Discharge Instructions (Addendum)
Please eat a high-fiber diet, drink MiraLAX daily to help regulate your bowel movement, and return if you have any concern.

## 2021-07-18 NOTE — ED Provider Notes (Signed)
[facility] EMERGENCY DEPARTMENT Provider Note   CSN: [id number] Arrival date & time: 07/18/21  4098     History No chief complaint on file.   Kristina Barnett is a 20 y.o. female.  The history is provided by the patient. No language interpreter was used.   20 year old female who presents with complaints of constipation.  Patient report for the past 2 weeks she has had persistent constipation producing very minimal bowel movement.  She report a lot of pressure about her rectum.  She is able to pass flatus.  She is currently taking iron supplementation and believes it may have contributed to her symptoms.  She tried using Dulcolax without relief.  She is not having any fever chills nausea vomiting or significant abdominal pain.  She recently had an abortion several weeks ago.  No prior abdominal surgery.  Does admit to having history of anemia taking iron supplementation.  History reviewed. No pertinent past medical history.  Patient Active Problem List   Diagnosis Date Noted   Postpartum anemia 06/30/2019   Normal postpartum course 06/30/2019   Circumvallate placenta during pregnancy in third trimester, antepartum 06/28/2019   Supervision of normal pregnancy, antepartum 01/07/2019   Asymptomatic bacteriuria during pregnancy 12/09/2018   Chlamydia infection affecting pregnancy 12/09/2018    History reviewed. No pertinent surgical history.   OB History     Gravida  2   Para  1   Term  1   Preterm      AB      Living  1      SAB      IAB      Ectopic      Multiple  0   Live Births  1           No family history on file.  Social History   Tobacco Use   Smoking status: Never   Smokeless tobacco: Never  Vaping Use   Vaping Use: Never used  Substance Use Topics   Alcohol use: No   Drug use: No    Home Medications Prior to Admission medications   Medication Sig Start Date End Date Taking? Authorizing Provider  Prenatal  Vit-Fe Fumarate-FA (PRENATAL VITAMIN) 27-0.8 MG TABS Take 1 tablet by mouth daily. 10/19/20   Dione Booze, MD  ferrous sulfate 325 (65 FE) MG tablet Take 1 tablet (325 mg total) by mouth 2 (two) times daily with a meal. Patient not taking: Reported on 10/19/2020 07/01/19 10/19/20  Jaymes Graff, MD    Allergies    Patient has no known allergies.  Review of Systems   Review of Systems  All other systems reviewed and are negative.  Physical Exam Updated Vital Signs BP 121/70 (BP Location: Left Arm)   Pulse 71   Temp 98.2 F (36.8 C)   Resp 14   SpO2 100%   Physical Exam Vitals and nursing note reviewed.  Constitutional:      General: She is not in acute distress.    Appearance: She is well-developed.  HENT:     Head: Atraumatic.  Eyes:     Conjunctiva/sclera: Conjunctivae normal.  Pulmonary:     Effort: Pulmonary effort is normal.  Abdominal:     Palpations: Abdomen is soft.     Tenderness: There is no abdominal tenderness.  Genitourinary:    Comments: Chaperone present during exam.  Normal rectal tone no obvious mass, firm stool appreciated at the rectal vault.  Normal color stool on glove.  Musculoskeletal:     Cervical back: Neck supple.  Skin:    Findings: No rash.  Neurological:     Mental Status: She is alert.  Psychiatric:        Mood and Affect: Mood normal.    ED Results / Procedures / Treatments   Labs (all labs ordered are listed, but only abnormal results are displayed) Labs Reviewed  CBC WITH DIFFERENTIAL/PLATELET - Abnormal; Notable for the following components:      Result Value   Hemoglobin 9.8 (*)    HCT 34.8 (*)    MCV 72.3 (*)    MCH 20.4 (*)    MCHC 28.2 (*)    RDW 19.3 (*)    Platelets 415 (*)    All other components within normal limits  I-STAT BETA HCG BLOOD, ED ([facility], [facility], [facility] ONLY) - Abnormal; Notable for the following components:   I-stat hCG, quantitative 11.3 (*)    All other components within normal limits  COMPREHENSIVE METABOLIC  PANEL  HCG, QUANTITATIVE, PREGNANCY    EKG None  Radiology DG Abd Acute W/Chest  Result Date: 07/18/2021 CLINICAL DATA:  Constipation. EXAM: DG ABDOMEN ACUTE WITH 1 VIEW CHEST COMPARISON:  Chest x-ray 12/06/2013 FINDINGS: The lungs are clear without focal pneumonia, edema, pneumothorax or pleural effusion. The cardiopericardial silhouette is within normal limits for size. Upright film shows no evidence for intraperitoneal free air. No small bowel dilatation to suggest obstruction. Markedly large formed stool volume seen in the left colon. Rectum is dramatically distended up to about 13 cm. Thoracolumbar scoliosis evident. IMPRESSION: Markedly large stool volume in the left colon with dramatic distention of the rectum. Correlation for fecal impaction recommended. Electronically Signed   By: Kennith Center M.D.   On: 07/18/2021 12:30    Procedures Fecal disimpaction  Date/Time: 07/18/2021 2:08 PM Performed by: Fayrene Helper, PA-C Authorized by: Fayrene Helper, PA-C  Consent: Verbal consent obtained. Risks and benefits: risks, benefits and alternatives were discussed Consent given by: patient Patient understanding: patient states understanding of the procedure being performed Patient consent: the patient's understanding of the procedure matches consent given Procedure consent: procedure consent matches procedure scheduled Local anesthesia used: yes  Anesthesia: Local anesthesia used: yes Local Anesthetic: topical anesthetic Anesthetic total: 8 mL  Sedation: Patient sedated: no  Patient tolerance: patient tolerated the procedure well with no immediate complications Comments: Manual disimpaction performed by me with chaperone present.  I was able to manually disimpact a large amount of stool.  Patient tolerates well.     Medications Ordered in ED Medications  lidocaine (XYLOCAINE) 2 % jelly 1 application (1 application Topical Given 07/18/21 1644)  sorbitol, milk of mag, mineral oil,  glycerin (SMOG) enema (960 mLs Rectal Given 07/18/21 1644)    ED Course  I have reviewed the triage vital signs and the nursing notes.  Pertinent labs & imaging results that were available during my care of the patient were reviewed by me and considered in my medical decision making (see chart for details).    MDM Rules/Calculators/A&P                           BP 117/60   Pulse 84   Temp 98.2 F (36.8 C)   Resp 16   SpO2 95%   Final Clinical Impression(s) / ED Diagnoses Final diagnoses:  Other constipation    Rx / DC Orders ED Discharge Orders  Ordered    polyethylene glycol powder (GLYCOLAX/MIRALAX) 17 GM/SCOOP powder  Daily        07/18/21 1737           1:31 PM Patient here with complaints of constipation for the past 2 weeks.  Acute abdominal series obtained showing some moderately large stool volume in the left colon with traumatic distention of the rectum.  This finding is consistent with physical exam.  Will perform manual disimpaction.  2:09 PM I was able to manually disimpact a large firm stool.  We will give smog enema to help evacuate bowel.  4:28 PM Patient is still overweight smog enema.  She did attempt to eat have a bowel movement without any success.  5:36 PM Patient received smog enema, able to produce a large bowel movement and felt much better.  She is stable for discharge.  Recommend MiraLAX.  Return precaution given.   Fayrene Helper, PA-C 07/18/21 1738    Gloris Manchester, MD 07/20/21 [phone]

## 2021-07-18 NOTE — ED Triage Notes (Signed)
Patient complains of constipation x 1-2 weeks and has been using laxatives with minimal relief. Patient complains of abdominal pain with same

## 2021-07-27 ENCOUNTER — Other Ambulatory Visit: Payer: Self-pay | Admitting: Orthopedic Surgery

## 2021-07-27 DIAGNOSIS — M25311 Other instability, right shoulder: Secondary | ICD-10-CM

## 2021-08-02 ENCOUNTER — Other Ambulatory Visit: Payer: Self-pay | Admitting: Orthopedic Surgery

## 2021-08-02 DIAGNOSIS — M25311 Other instability, right shoulder: Secondary | ICD-10-CM

## 2021-08-18 ENCOUNTER — Other Ambulatory Visit: Payer: Medicaid Other
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
## 2023-06-27 LAB — OB RESULTS CONSOLE RUBELLA ANTIBODY, IGM: Rubella: IMMUNE

## 2023-07-15 ENCOUNTER — Emergency Department (HOSPITAL_COMMUNITY)
Admission: EM | Admit: 2023-07-15 | Discharge: 2023-07-15 | Disposition: A | Discharge status: Home / Self Care | Payer: Managed Care, Other (non HMO) | Attending: Emergency Medicine | Admitting: Emergency Medicine

## 2023-07-15 ENCOUNTER — Encounter (HOSPITAL_COMMUNITY): Payer: Self-pay

## 2023-07-15 ENCOUNTER — Other Ambulatory Visit: Payer: Self-pay

## 2023-07-15 DIAGNOSIS — O26899 Other specified pregnancy related conditions, unspecified trimester: Secondary | ICD-10-CM | POA: Diagnosis present

## 2023-07-15 DIAGNOSIS — S0083XA Contusion of other part of head, initial encounter: Secondary | ICD-10-CM | POA: Diagnosis not present

## 2023-07-15 DIAGNOSIS — O9A219 Injury, poisoning and certain other consequences of external causes complicating pregnancy, unspecified trimester: Secondary | ICD-10-CM | POA: Diagnosis not present

## 2023-07-15 DIAGNOSIS — Z3A00 Weeks of gestation of pregnancy not specified: Secondary | ICD-10-CM | POA: Diagnosis not present

## 2023-07-15 NOTE — ED Notes (Signed)
AVS provided to and discussed with patient and family member at bedside. Pt verbalizes understanding of discharge instructions and denies any questions or concerns at this time. Pt has ride home. Pt ambulated out of department independently with steady gait.  
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
## 2023-07-22 ENCOUNTER — Encounter (HOSPITAL_COMMUNITY): Payer: Self-pay

## 2023-07-22 ENCOUNTER — Emergency Department (HOSPITAL_COMMUNITY): Payer: Managed Care, Other (non HMO)

## 2023-07-22 ENCOUNTER — Emergency Department (HOSPITAL_COMMUNITY)
Admission: EM | Admit: 2023-07-22 | Discharge: 2023-07-22 | Disposition: A | Discharge status: Home / Self Care | Payer: Managed Care, Other (non HMO)

## 2023-07-22 DIAGNOSIS — S0083XD Contusion of other part of head, subsequent encounter: Secondary | ICD-10-CM

## 2023-07-22 DIAGNOSIS — O26891 Other specified pregnancy related conditions, first trimester: Secondary | ICD-10-CM | POA: Diagnosis not present

## 2023-07-22 DIAGNOSIS — S0011XA Contusion of right eyelid and periocular area, initial encounter: Secondary | ICD-10-CM | POA: Diagnosis not present

## 2023-07-22 DIAGNOSIS — O9A211 Injury, poisoning and certain other consequences of external causes complicating pregnancy, first trimester: Secondary | ICD-10-CM | POA: Diagnosis present

## 2023-07-22 DIAGNOSIS — Z3A13 13 weeks gestation of pregnancy: Secondary | ICD-10-CM | POA: Diagnosis not present

## 2023-07-22 DIAGNOSIS — R109 Unspecified abdominal pain: Secondary | ICD-10-CM | POA: Insufficient documentation

## 2023-07-22 NOTE — Discharge Instructions (Addendum)
Please follow-up with your primary doctor as soon as possible.  Please go to your OB/GYN appointment as discussed.  We have given you the number to ophthalmology that she can follow-up with regarding flashes of light that you have intermittently been seen.  Return immediately if develop fevers, chills, sudden onset headache, vision changes/loss or eye pain.  Also, please return if develop any new or worsening symptoms that are concerning to you.
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
## 2023-10-05 ENCOUNTER — Inpatient Hospital Stay (HOSPITAL_COMMUNITY)
Admission: AD | Admit: 2023-10-05 | Discharge: 2023-10-05 | Disposition: A | Discharge status: Home / Self Care | Payer: Managed Care, Other (non HMO) | Attending: Obstetrics and Gynecology | Admitting: Obstetrics and Gynecology

## 2023-10-05 ENCOUNTER — Encounter (HOSPITAL_COMMUNITY): Payer: Self-pay | Admitting: Obstetrics and Gynecology

## 2023-10-05 DIAGNOSIS — Z202 Contact with and (suspected) exposure to infections with a predominantly sexual mode of transmission: Secondary | ICD-10-CM | POA: Diagnosis not present

## 2023-10-05 DIAGNOSIS — N760 Acute vaginitis: Secondary | ICD-10-CM

## 2023-10-05 DIAGNOSIS — O26892 Other specified pregnancy related conditions, second trimester: Secondary | ICD-10-CM | POA: Diagnosis not present

## 2023-10-05 DIAGNOSIS — Z3A23 23 weeks gestation of pregnancy: Secondary | ICD-10-CM

## 2023-10-05 DIAGNOSIS — O98512 Other viral diseases complicating pregnancy, second trimester: Secondary | ICD-10-CM | POA: Diagnosis not present

## 2023-10-05 DIAGNOSIS — R1010 Upper abdominal pain, unspecified: Secondary | ICD-10-CM | POA: Diagnosis present

## 2023-10-05 DIAGNOSIS — O23592 Infection of other part of genital tract in pregnancy, second trimester: Secondary | ICD-10-CM | POA: Diagnosis not present

## 2023-10-05 DIAGNOSIS — B9689 Other specified bacterial agents as the cause of diseases classified elsewhere: Secondary | ICD-10-CM | POA: Insufficient documentation

## 2023-10-05 DIAGNOSIS — T148XXA Other injury of unspecified body region, initial encounter: Secondary | ICD-10-CM

## 2023-10-05 LAB — URINALYSIS, ROUTINE W REFLEX MICROSCOPIC
Bilirubin Urine: NEGATIVE
Glucose, UA: NEGATIVE mg/dL
Hgb urine dipstick: NEGATIVE
Ketones, ur: 20 mg/dL — AB
Nitrite: POSITIVE — AB
Protein, ur: 100 mg/dL — AB
Specific Gravity, Urine: 1.023 (ref 1.005–1.030)
Squamous Epithelial / HPF: >50 /[HPF] (ref 0–5)
WBC, UA: >50 WBC/hpf (ref 0–5)
pH: 7 (ref 5.0–8.0)

## 2023-10-05 LAB — WET PREP, GENITAL
Sperm: NONE SEEN
Trich, Wet Prep: NONE SEEN
WBC, Wet Prep HPF POC: >=10 — AB (ref <10)
Yeast Wet Prep HPF POC: NONE SEEN

## 2023-10-05 LAB — HEPATITIS B SURFACE ANTIGEN: Hepatitis B Surface Ag: NONREACTIVE

## 2023-10-05 LAB — HIV ANTIBODY (ROUTINE TESTING W REFLEX): HIV Screen 4th Generation wRfx: NONREACTIVE

## 2023-10-05 LAB — HEPATITIS C ANTIBODY: HCV Ab: NONREACTIVE

## 2023-10-05 MED ORDER — METRONIDAZOLE 0.75 % VA GEL: 1.0000 | Freq: Every day | VAGINAL | 1 refills | Status: DC

## 2023-10-05 NOTE — MAU Provider Note (Signed)
MAU Provider Note  Chief Complaint: Vaginal Discharge, Vaginal Itching, and Abdominal Pain (Constant upper abdominal pain)   Event Date/Time   First Provider Initiated Contact with Patient 10/05/23 1433      SUBJECTIVE HPI: LAVAYA SIT is a 22 y.o. G3P1011 at [redacted]w[redacted]d by LMP who presents to maternity admissions reporting vaginal discharge. Pregnancy c/b none. Receives [facility] with [facility].  Patient notes malodorous discharge for past several days and vaginal itching. Notes her boyfriend was recently diagnosed with herpes and has had other partners recently. Has not noticed an lesions on herself or having any pain. OB sent in antibiotic for UTI; will pick this up today. Denies VB, LOF, fever/chills. +FM.  Has pain upper abdomen when she moves. Feels "like a muscle pull". Not related to food.  HPI  History reviewed. No pertinent past medical history. Past Surgical History:  Procedure Laterality Date   NO PAST SURGERIES     Social History   Socioeconomic History   Marital status: Single    Spouse name: Not on file   Number of children: Not on file   Years of education: Not on file   Highest education level: Not on file  Occupational History   Not on file  Tobacco Use   Smoking status: Never   Smokeless tobacco: Never  Vaping Use   Vaping status: Never Used  Substance and Sexual Activity   Alcohol use: No   Drug use: No   Sexual activity: Yes    Partners: Male    Birth control/protection: None    Comment: Pregnant   Other Topics Concern   Not on file  Social History Narrative   Not on file   Social Determinants of Health   Financial Resource Strain: Not on file  Food Insecurity: Not on file  Transportation Needs: Not on file  Physical Activity: Not on file  Stress: Not on file  Social Connections: Not on file  Intimate Partner Violence: Not on file   No current facility-administered medications on file prior to encounter.   Current Outpatient  Medications on File Prior to Encounter  Medication Sig Dispense Refill   Prenatal Vit-Fe Fumarate-FA (PRENATAL VITAMIN) 27-0.8 MG TABS Take 1 tablet by mouth daily. 30 tablet 0   polyethylene glycol powder (GLYCOLAX/MIRALAX) 17 GM/SCOOP powder Take 17 g by mouth daily. 255 g 0   [DISCONTINUED] ferrous sulfate 325 (65 FE) MG tablet Take 1 tablet (325 mg total) by mouth 2 (two) times daily with a meal. (Patient not taking: Reported on 10/19/2020) 60 tablet 3   No Known Allergies  ROS:  Pertinent positives/negatives listed above.  I have reviewed patient's Past Medical Hx, Surgical Hx, Family Hx, Social Hx, medications and allergies.   Physical Exam  Patient Vitals for the past 24 hrs:  BP Temp Temp src Pulse Resp SpO2 Height Weight  10/05/23 1428 (!) 113/59 -- -- 96 18 100 % -- --  10/05/23 1414 126/61 -- -- 100 16 -- -- --  10/05/23 1413 -- 98.8 F (37.1 C) Oral -- -- -- -- --  10/05/23 1357 -- -- -- -- -- -- 5\' 7"  (1.702 m) 82.4 kg   Constitutional: Well-developed, well-nourished female in no acute distress  Cardiovascular: normal rate Respiratory: normal effort GI: Abd soft, non-tender. Mild tenderness to palpation just above abdomen MS: Extremities nontender, no edema, normal ROM Neurologic: Alert and oriented x 4  GU: Neg CVAT  PELVIC EXAM: Cervix pink, visually closed, without lesion, copious white/malodorous discharge,  vaginal walls and external genitalia normal. No bleeding. No lesions  FHT:  Baseline 145, moderate variability, 10x10 accelerations present, no decelerations Contractions: none  LAB RESULTS Results for orders placed or performed during the hospital encounter of 10/05/23 (from the past 24 hour(s))  Wet prep, genital     Status: Abnormal   Collection Time: 10/05/23  2:47 PM   Specimen: PATH Cytology Cervicovaginal Ancillary Only  Result Value Ref Range   Yeast Wet Prep HPF POC NONE SEEN NONE SEEN   Trich, Wet Prep NONE SEEN NONE SEEN   Clue Cells Wet Prep  HPF POC PRESENT (A) NONE SEEN   WBC, Wet Prep HPF POC >=10 (A) <10   Sperm NONE SEEN       IMAGING No results found.  MAU Management/MDM: Orders Placed This Encounter  Procedures   Culture, OB Urine   Wet prep, genital   Urinalysis, Routine w reflex microscopic -Urine, Clean Catch   HIV Antibody (routine testing w rflx)   Hepatitis C antibody   Hepatitis B surface antigen   RPR   Discharge patient    Meds ordered this encounter  Medications   metroNIDAZOLE (METROGEL) 0.75 % vaginal gel    Sig: Place 1 Applicatorful vaginally at bedtime.    Dispense:  70 g    Refill:  1     Available prenatal records reviewed.  Patient presents with malodorous vaginal discharge and potential exposure to STIs. Reassuringly no herpetic lesions on exam today, however discussed what expected symptoms would be if she were to develop these. She was amenable to full STI screen. Wet prep positive for BV; will treat with metrogel. GC/C, HIV, Hep C, Hep B, RPR pending at discharge. Encouraged using barrier protection with current partner. Patient has access to return to OB or MAU if any of these are positive and need treatment.  Pain in abdomen consistent with muscle strain. Conservative measures discussed.  FWB: Reassuring, reactive NST for gestation.  ASSESSMENT 1. Bacterial vaginosis   2. Muscle strain   3. Possible exposure to STI   4. [redacted] weeks gestation of pregnancy     PLAN Discharge home with strict return precautions. Allergies as of 10/05/2023   No Known Allergies      Medication List     TAKE these medications    metroNIDAZOLE 0.75 % vaginal gel Commonly known as: METROGEL Place 1 Applicatorful vaginally at bedtime.   polyethylene glycol powder 17 GM/SCOOP powder Commonly known as: GLYCOLAX/MIRALAX Take 17 g by mouth daily.   Prenatal Vitamin 27-0.8 MG Tabs Take 1 tablet by mouth daily.         Wylene Simmer, MD OB Fellow 10/05/2023  3:33 PM

## 2023-10-05 NOTE — MAU Note (Signed)
..  Kristina Barnett is a 22 y.o. at [redacted]w[redacted]d here in MAU reporting: persistent upper abdominal pain at a 4/10 that feels like "pulling a muscle". Vaginal pain, itchiness, and discharge. Boyfriend recently told her that he "possible has herpes". Pt recently diagnosed in office with a UTI. Has not started medication for UTI treatment yet. Denies contractions, leaking of fluid, vaginal bleeding. Endorses +FM.  Vitals:   10/05/23 1414 10/05/23 1428  BP: 126/61 (!) 113/59  Pulse: 100 96  Resp: 16 18  Temp:    SpO2:  100%     FHT:145 Lab orders placed from triage:  UA and Cultures

## 2023-10-06 LAB — RPR: RPR Ser Ql: NONREACTIVE

## 2023-10-07 LAB — CULTURE, OB URINE: Culture: >=100000 — AB

## 2023-10-08 ENCOUNTER — Inpatient Hospital Stay (HOSPITAL_COMMUNITY)
Admission: AD | Admit: 2023-10-08 | Discharge: 2023-10-09 | Disposition: A | Discharge status: Home / Self Care | Payer: Managed Care, Other (non HMO) | Attending: Obstetrics and Gynecology | Admitting: Obstetrics and Gynecology

## 2023-10-08 ENCOUNTER — Encounter (HOSPITAL_COMMUNITY): Payer: Self-pay | Admitting: *Deleted

## 2023-10-08 DIAGNOSIS — Z113 Encounter for screening for infections with a predominantly sexual mode of transmission: Secondary | ICD-10-CM | POA: Insufficient documentation

## 2023-10-08 DIAGNOSIS — Z202 Contact with and (suspected) exposure to infections with a predominantly sexual mode of transmission: Secondary | ICD-10-CM | POA: Insufficient documentation

## 2023-10-08 DIAGNOSIS — A549 Gonococcal infection, unspecified: Secondary | ICD-10-CM

## 2023-10-08 DIAGNOSIS — O26892 Other specified pregnancy related conditions, second trimester: Secondary | ICD-10-CM | POA: Insufficient documentation

## 2023-10-08 DIAGNOSIS — Z3A24 24 weeks gestation of pregnancy: Secondary | ICD-10-CM | POA: Diagnosis not present

## 2023-10-08 DIAGNOSIS — N898 Other specified noninflammatory disorders of vagina: Secondary | ICD-10-CM

## 2023-10-08 LAB — GC/CHLAMYDIA PROBE AMP (~~LOC~~) NOT AT ARMC
Chlamydia: NEGATIVE
Comment: NEGATIVE
Comment: NORMAL
Neisseria Gonorrhea: POSITIVE — AB

## 2023-10-08 MED ORDER — AZITHROMYCIN 250 MG PO TABS
1000.0000 mg | ORAL_TABLET | Freq: Once | ORAL | Status: AC
  Administered 2023-10-09: 1000 mg via ORAL
  Filled 2023-10-08: qty 4

## 2023-10-08 MED ORDER — CEFTRIAXONE SODIUM 500 MG IJ SOLR
500.0000 mg | Freq: Once | INTRAMUSCULAR | Status: AC
  Administered 2023-10-09: 500 mg via INTRAMUSCULAR
  Filled 2023-10-08: qty 500

## 2023-10-08 NOTE — MAU Provider Note (Incomplete)
History     CSN: [id number]  Arrival date and time: 10/08/23 2150   Event Date/Time   First Provider Initiated Contact with Patient 10/08/23 2343      Chief Complaint  Patient presents with  . Vaginal Pain   Kristina Barnett , a  23 y.o. G3P1011 at [redacted]w[redacted]d presents to MAU with complaints of recent HSV exposure. She reports that since Friday she has noted a increase in vaginal irritation and she states that today she noted a few "scab" areas  that are sensitive to touch. She also notes painful urination and green vaginal discharge. Patient was recently seen in MAU and was tested for STDs and tested positive for BV and Gonorrhea. Patient states that she is also currently receiving treatment for a UTI. She has no other pregnancy complaints.        Vaginal Pain The patient's primary symptoms include vaginal discharge. The patient's pertinent negatives include no pelvic pain. Pertinent negatives include no abdominal pain, back pain, chills, constipation, diarrhea, dysuria, fever, headaches, nausea or vomiting.    OB History     Gravida  3   Para  1   Term  1   Preterm      AB  1   Living  1      SAB      IAB      Ectopic      Multiple  0   Live Births  1           History reviewed. No pertinent past medical history.  Past Surgical History:  Procedure Laterality Date  . NO PAST SURGERIES      No family history on file.  Social History   Tobacco Use  . Smoking status: Never  . Smokeless tobacco: Never  Vaping Use  . Vaping status: Never Used  Substance Use Topics  . Alcohol use: No  . Drug use: No    Allergies: No Known Allergies  Medications Prior to Admission  Medication Sig Dispense Refill Last Dose  . ceFAZolin (ANCEF) IVPB Inject into the vein.     . metroNIDAZOLE (METROGEL) 0.75 % vaginal gel Place 1 Applicatorful vaginally at bedtime. 70 g 1 10/07/2023  . Prenatal Vit-Fe Fumarate-FA (PRENATAL VITAMIN) 27-0.8 MG TABS Take 1 tablet  by mouth daily. 30 tablet 0 Past Week  . polyethylene glycol powder (GLYCOLAX/MIRALAX) 17 GM/SCOOP powder Take 17 g by mouth daily. 255 g 0 More than a month    Review of Systems  Constitutional:  Negative for chills, fatigue and fever.  Eyes:  Negative for pain and visual disturbance.  Respiratory:  Negative for apnea, shortness of breath and wheezing.   Cardiovascular:  Negative for chest pain and palpitations.  Gastrointestinal:  Negative for abdominal pain, constipation, diarrhea, nausea and vomiting.  Genitourinary:  Positive for vaginal discharge and vaginal pain. Negative for difficulty urinating, dysuria, pelvic pain and vaginal bleeding.  Musculoskeletal:  Negative for back pain.  Neurological:  Negative for seizures, weakness and headaches.  Psychiatric/Behavioral:  Negative for suicidal ideas.    Physical Exam   Blood pressure 105/69, pulse 92, temperature 98.3 F (36.8 C), temperature source Oral, resp. rate 14, height 5' 7.5" (1.715 m), weight 81.2 kg, unknown if currently breastfeeding.  Physical Exam Vitals and nursing note reviewed. Exam conducted with a chaperone present.  Constitutional:      General: She is not in acute distress.    Appearance: Normal appearance.  HENT:     Head:  Normocephalic.  Pulmonary:     Effort: Pulmonary effort is normal.  Genitourinary:      Comments: 3 small scratches no Musculoskeletal:     Cervical back: Normal range of motion.  Skin:    General: Skin is warm and dry.  Neurological:     Mental Status: She is alert and oriented to person, place, and time.  Psychiatric:        Mood and Affect: Mood normal.     MAU Course  Procedures  MDM ***  Assessment and Plan  ***  Claudette Head 10/08/2023, 11:58 PM

## 2023-10-08 NOTE — MAU Provider Note (Signed)
History     CSN: [id number]  Arrival date and time: 10/08/23 2150   Event Date/Time   First Provider Initiated Contact with Patient 10/08/23 2343      Chief Complaint  Patient presents with   Vaginal Pain   Kristina Barnett , a  22 y.o. G3P1011 at [redacted]w[redacted]d presents to MAU with complaints of recent HSV exposure. She reports that since Friday she has noted a increase in vaginal irritation and she states that today she noted a few "scab" areas  that are sensitive to touch. She also notes painful urination and green vaginal discharge. She noted last intercourse was 2-3 weeks ago and she reports that she has just been "feeling unwell." No cold or flu like symptoms. Patient was recently seen in MAU and was tested for STDs and tested positive for BV and Gonorrhea. Patient states that she is also currently receiving treatment for a UTI. She has no other pregnancy complaints.        Vaginal Pain The patient's primary symptoms include vaginal discharge. The patient's pertinent negatives include no pelvic pain. Pertinent negatives include no abdominal pain, back pain, chills, constipation, diarrhea, dysuria, fever, headaches, nausea or vomiting.    OB History     Gravida  3   Para  1   Term  1   Preterm      AB  1   Living  1      SAB      IAB      Ectopic      Multiple  0   Live Births  1           History reviewed. No pertinent past medical history.  Past Surgical History:  Procedure Laterality Date   NO PAST SURGERIES      No family history on file.  Social History   Tobacco Use   Smoking status: Never   Smokeless tobacco: Never  Vaping Use   Vaping status: Never Used  Substance Use Topics   Alcohol use: No   Drug use: No    Allergies: No Known Allergies  Medications Prior to Admission  Medication Sig Dispense Refill Last Dose   ceFAZolin (ANCEF) IVPB Inject into the vein.      metroNIDAZOLE (METROGEL) 0.75 % vaginal gel Place 1  Applicatorful vaginally at bedtime. 70 g 1 10/07/2023   Prenatal Vit-Fe Fumarate-FA (PRENATAL VITAMIN) 27-0.8 MG TABS Take 1 tablet by mouth daily. 30 tablet 0 Past Week   polyethylene glycol powder (GLYCOLAX/MIRALAX) 17 GM/SCOOP powder Take 17 g by mouth daily. 255 g 0 More than a month    Review of Systems  Constitutional:  Negative for chills, fatigue and fever.  Eyes:  Negative for pain and visual disturbance.  Respiratory:  Negative for apnea, shortness of breath and wheezing.   Cardiovascular:  Negative for chest pain and palpitations.  Gastrointestinal:  Negative for abdominal pain, constipation, diarrhea, nausea and vomiting.  Genitourinary:  Positive for vaginal discharge and vaginal pain. Negative for difficulty urinating, dysuria, pelvic pain and vaginal bleeding.  Musculoskeletal:  Negative for back pain.  Neurological:  Negative for seizures, weakness and headaches.  Psychiatric/Behavioral:  Negative for suicidal ideas.    Physical Exam   Blood pressure 105/69, pulse 92, temperature 98.3 F (36.8 C), temperature source Oral, resp. rate 14, height 5' 7.5" (1.715 m), weight 81.2 kg, unknown if currently breastfeeding.  Physical Exam Vitals and nursing note reviewed. Exam conducted with a chaperone present.  Constitutional:  General: She is not in acute distress.    Appearance: Normal appearance.  HENT:     Head: Normocephalic.  Pulmonary:     Effort: Pulmonary effort is normal.  Genitourinary:      Comments: 3 small lesions noted. Swabs collected.  Musculoskeletal:     Cervical back: Normal range of motion.  Skin:    General: Skin is warm and dry.  Neurological:     Mental Status: She is alert and oriented to person, place, and time.  Psychiatric:        Mood and Affect: Mood normal.    FHT appropriate for gestational age.  MAU Course  Procedures Orders Placed This Encounter  Procedures   Hsv Culture And Typing   Meds ordered this encounter   Medications   AND Linked Order Group    azithromycin (ZITHROMAX) tablet 1,000 mg    cefTRIAXone (ROCEPHIN) injection 500 mg     Order Specific Question:   Antibiotic Indication:     Answer:   STD   lidocaine (PF) (XYLOCAINE) 1 % injection    Meda Coffee, Debra J: cabinet override    MDM - CNM inofrmed patient of positive gonorrhea results.  - Plan treat in MAU and plan for discharge.   Assessment and Plan   1. STD exposure   2. Vaginal discharge   3. Gonorrhea   4. [redacted] weeks gestation of pregnancy    - Reviewed worsening signs and return precautions.  - Recommended to watch lesions for worsening signs. Recommended to abstain from intercourse for 21 days and recommended partner testing.  - Patient discharged home in stable condition and may return to MAU as needed.   Claudette Head, MSN CNM  10/08/2023, 11:58 PM

## 2023-10-08 NOTE — MAU Note (Signed)
Pt says she was here on Friday- for cramping , vag irritation- tests. Says now saw scabbed bumps - at 8 pm.  Denies HSV Denies UC's  [facility]- [facility]- saw Chip Boer, [place]

## 2023-10-09 DIAGNOSIS — Z3A24 24 weeks gestation of pregnancy: Secondary | ICD-10-CM | POA: Diagnosis not present

## 2023-10-09 DIAGNOSIS — Z202 Contact with and (suspected) exposure to infections with a predominantly sexual mode of transmission: Secondary | ICD-10-CM

## 2023-10-09 DIAGNOSIS — O26892 Other specified pregnancy related conditions, second trimester: Secondary | ICD-10-CM | POA: Diagnosis not present

## 2023-10-09 DIAGNOSIS — A549 Gonococcal infection, unspecified: Secondary | ICD-10-CM

## 2023-10-09 DIAGNOSIS — N898 Other specified noninflammatory disorders of vagina: Secondary | ICD-10-CM

## 2023-10-09 MED ORDER — LIDOCAINE HCL (PF) 1 % IJ SOLN
INTRAMUSCULAR | Status: AC
  Administered 2023-10-09: 1 mL
  Filled 2023-10-09: qty 5

## 2023-10-11 LAB — HSV CULTURE AND TYPING

## 2023-11-21 NOTE — L&D Delivery Note (Signed)
 Delivery Note    Patient Name: Kristina Barnett DOB: 17-Sep-2001 MRN: [id number]  Date of admission: 01/24/2024 Delivering MD: Dale Merkel Date of delivery: 01/24/2024 Type of delivery: SVD  Newborn Data: Live born female  Birth Weight: 7 lb 6.2 oz (3350 g) APGAR: 9, 9  Newborn Delivery   Birth date/time: 01/24/2024 11:49:12 Delivery type: Vaginal, Spontaneous       [name], 22 y.o., @ [redacted]w[redacted]d,  [id number], who was admitted for active labor. I was called to the room when she progressed 1+ station in the second stage of labor.  She pushed for 45/min.  She delivered a viable infant, cephalic and restituted to the ROA position over an intact perineum.  A nuchal cord   was identified. The baby was placed on maternal abdomen while initial step of NRP were perfmored (Dry, Stimulated, and warmed). Hat placed on baby for thermoregulation. Delayed cord clamping was performed for 2 minutes.  Cord double clamped and cut.  Cord cut by fob. Apgar scores were 9 and 9. Prophylactic Pitocin was started in the third stage of labor for active management. The placenta delivered spontaneously, shultz, with a 3 vessel cord and was sent to LD.  Inspection revealed 2nd degree. An examination of the vaginal vault and cervix was free from lacerations. The uterus was firm, bleeding stable.  The repair was done under lidocaine.   Placenta and umbilical artery blood gas were not sent.  There were no complications during the procedure.  Mom and baby skin to skin following delivery. Left in stable condition.  Maternal Info: Anesthesia: Local lidocaine Episiotomy: no Lacerations:  2nd Suture Repair: yes 3.0 CT-vicryl Est. Blood Loss (mL):   Newborn Info:  Baby Sex: female Circumcision: in pt  APGAR (1 MIN): 9  APGAR (5 MINS): 9  APGAR (10 MINS):     Mom to postpartum.  Baby to Couplet care / Skin to Skin.  Delivery Report:    Review the Delivery Report for details.   [facility] CNM,  FNP-C, PMHNP-BC  [id number]  [place], [place] [address]  Cell: [phone]  Office Phone: [phone] Fax: [phone] 01/24/2024  1:07 PM

## 2023-11-29 ENCOUNTER — Inpatient Hospital Stay (HOSPITAL_COMMUNITY)
Admission: AD | Admit: 2023-11-29 | Discharge: 2023-11-29 | Disposition: A | Discharge status: Home / Self Care | Payer: Managed Care, Other (non HMO) | Attending: Obstetrics and Gynecology | Admitting: Obstetrics and Gynecology

## 2023-11-29 ENCOUNTER — Encounter (HOSPITAL_COMMUNITY): Payer: Self-pay | Admitting: Obstetrics and Gynecology

## 2023-11-29 ENCOUNTER — Telehealth: Payer: Self-pay | Admitting: *Deleted

## 2023-11-29 DIAGNOSIS — O99613 Diseases of the digestive system complicating pregnancy, third trimester: Secondary | ICD-10-CM | POA: Diagnosis present

## 2023-11-29 DIAGNOSIS — K5900 Constipation, unspecified: Secondary | ICD-10-CM

## 2023-11-29 DIAGNOSIS — O26893 Other specified pregnancy related conditions, third trimester: Secondary | ICD-10-CM | POA: Diagnosis not present

## 2023-11-29 DIAGNOSIS — K5909 Other constipation: Secondary | ICD-10-CM | POA: Insufficient documentation

## 2023-11-29 DIAGNOSIS — O26899 Other specified pregnancy related conditions, unspecified trimester: Secondary | ICD-10-CM

## 2023-11-29 DIAGNOSIS — R109 Unspecified abdominal pain: Secondary | ICD-10-CM | POA: Diagnosis not present

## 2023-11-29 DIAGNOSIS — Z3A31 31 weeks gestation of pregnancy: Secondary | ICD-10-CM | POA: Diagnosis not present

## 2023-11-29 MED ORDER — POLYETHYLENE GLYCOL 3350 17 GM/SCOOP PO POWD: 1.0000 | ORAL | 0 refills | Status: AC | PRN

## 2023-11-29 NOTE — MAU Provider Note (Signed)
 Chief Complaint:  No chief complaint on file.   Event Date/Time   First Provider Initiated Contact with Patient 11/29/23 [phone]     HPI: Kristina Barnett is a 23 y.o. G3P1011 at 31w4dwho presents to maternity admissions reporting abdominal cramping related to prolonged constipation. States has been 2 weeks.  States was told to use Dulcolax, which she used once without relief.  Did not seek other remedies. States has had this her whole life and it has never been evaluated or treated.  Has had to be given enemas in ED in past. . She reports good fetal movement, denies LOF, vaginal bleeding, vaginal itching/burning, urinary symptoms, h/a, dizziness, n/v, diarrhea, constipation or fever/chills.  She denies headache, visual changes or RUQ abdominal pain.  Abdominal Pain This is a recurrent problem. The current episode started 1 to 4 weeks ago. The quality of the pain is cramping. Associated symptoms include constipation. Pertinent negatives include no fever, frequency or melena. Nothing aggravates the pain. The pain is relieved by Nothing. Treatments tried: dulcolax. The treatment provided no relief.   RN Note: Kristina Barnett is a 23 y.o. at [redacted]w[redacted]d here in MAU reporting: constipation for two weeks and vaginal pressure and sharp pain for 1.5 weeks.  Came in today because the pain got worse tonight and couldn't sleep through the pain.   Denies vaginal bleeding or leaking of fluid.  Took doculax for constipation and had no relief.   Past Medical History: History reviewed. No pertinent past medical history.  Past obstetric history: OB History  Gravida Para Term Preterm AB Living  3 1 1  1 1   SAB IAB Ectopic Multiple Live Births     0 1    # Outcome Date GA Lbr Len/2nd Weight Sex Type Anes PTL Lv  3 Current           2 AB 2022          1 Term 06/29/19 [redacted]w[redacted]d 03:32 / 00:42 3340 g M Vag-Spont EPI  LIV    Past Surgical History: Past Surgical History:  Procedure Laterality Date   NO PAST  SURGERIES      Family History: History reviewed. No pertinent family history.  Social History: Social History   Tobacco Use   Smoking status: Never   Smokeless tobacco: Never  Vaping Use   Vaping status: Never Used  Substance Use Topics   Alcohol use: No   Drug use: No    Allergies: No Known Allergies  Meds:  Medications Prior to Admission  Medication Sig Dispense Refill Last Dose/Taking   metroNIDAZOLE  (METROGEL ) 0.75 % vaginal gel Place 1 Applicatorful vaginally at bedtime. 70 g 1    polyethylene glycol powder (GLYCOLAX /MIRALAX ) 17 GM/SCOOP powder Take 17 g by mouth daily. 255 g 0    Prenatal Vit-Fe Fumarate-FA (PRENATAL VITAMIN) 27-0.8 MG TABS Take 1 tablet by mouth daily. 30 tablet 0 Past Week    I have reviewed patient's Past Medical Hx, Surgical Hx, Family Hx, Social Hx, medications and allergies.   ROS:  Review of Systems  Constitutional:  Negative for fever.  Gastrointestinal:  Positive for abdominal pain and constipation. Negative for melena.  Genitourinary:  Negative for frequency.   Other systems negative  Physical Exam  Patient Vitals for the past 24 hrs:  BP Temp Temp src Pulse Resp SpO2 Height Weight  11/29/23 0505 130/72 98 F (36.7 C) Oral (!) 112 16 100 % 5' 7 (1.702 m) 89.2 kg   Constitutional: Well-developed, well-nourished  female in no acute distress.  Cardiovascular: normal rate  Respiratory: normal effort GI: Abd soft, non-tender, gravid appropriate for gestational age.   No rebound or guarding. MS: Extremities nontender, no edema, normal ROM Neurologic: Alert and oriented x 4.  GU: Neg CVAT.  PELVIC EXAM: Unable to reach cervix due to very large stool buden encroaching into vaginal space.  Checked after enema: Dilation: Fingertip Effacement (%): 60 Cervical Position: Posterior Exam by:: Earnie Pouch, CNM Very difficult to reach cervix but it feels effaced  FHT:  Baseline 140 , moderate variability, accelerations present, no  decelerations Contractions:  Rare   Labs: No results found for this or any previous visit (from the past 24 hours).    Imaging:  No results found.  MAU Course/MDM: I have reviewed the triage vital signs and the nursing notes.   Pertinent labs & imaging results that were available during my care of the patient were reviewed by me and considered in my medical decision making (see chart for details).      I have reviewed her medical records including past results, notes and treatments.   I have ordered labs and reviewed results.  NST reviewed  Treatments in MAU included soap suds enema with excellent results, very large amount of stool expelled .  Patient felt cramping afterward, so we put her back on Toco, no contractions seen.  Suspect cramping is intestinal  Discussed effacing cervix, pt to followup in office with recheck  Assessment: Single IUP at [redacted]w[redacted]d Abdominal cramping Chronic constipation   Plan: Discharge home Rx Miralax  prn WIll start daily metamucil  Fleets PRN, avoid waiting more than a week to treat Preterm Labor precautions and fetal kick counts Follow up in Office for prenatal visits and recheck of cervix Encouraged to return if she develops worsening of symptoms, increase in pain, fever, or other concerning symptoms.   Pt stable at time of discharge.  Earnie Pouch CNM, MSN Certified Nurse-Midwife 11/29/2023 5:23 AM

## 2023-11-29 NOTE — MAU Note (Signed)
..  Kristina Barnett is a 23 y.o. at [redacted]w[redacted]d here in MAU reporting: constipation for two weeks and vaginal pressure and sharp pain for 1.5 weeks.  Came in today because the pain got worse tonight and couldn't sleep through the pain.   Denies vaginal bleeding or leaking of fluid.  Took doculax for constipation and had no relief.   Pain score: 7/10 Vitals:   11/29/23 0505  BP: 130/72  Pulse: (!) 112  Resp: 16  Temp: 98 F (36.7 C)  SpO2: 100%     FHT:147 Lab orders placed from triage:  UA

## 2023-11-29 NOTE — Telephone Encounter (Signed)
 I called pharmacy at 1:45 pm and again at 2:45 and no answer. [name]

## 2023-11-29 NOTE — Telephone Encounter (Signed)
 Received a voicemail from [facility] requesting clarification on RX for Miralax powder.  [name]

## 2024-01-24 ENCOUNTER — Other Ambulatory Visit: Payer: Self-pay

## 2024-01-24 ENCOUNTER — Inpatient Hospital Stay (HOSPITAL_COMMUNITY)
Admission: AD | Admit: 2024-01-24 | Discharge: 2024-01-26 | DRG: 807 | Disposition: A | Discharge status: Home / Self Care | Attending: Obstetrics and Gynecology | Admitting: Obstetrics and Gynecology

## 2024-01-24 ENCOUNTER — Encounter (HOSPITAL_COMMUNITY): Payer: Self-pay | Admitting: Obstetrics and Gynecology

## 2024-01-24 DIAGNOSIS — O26893 Other specified pregnancy related conditions, third trimester: Secondary | ICD-10-CM | POA: Diagnosis present

## 2024-01-24 DIAGNOSIS — Z3A39 39 weeks gestation of pregnancy: Secondary | ICD-10-CM

## 2024-01-24 DIAGNOSIS — O9902 Anemia complicating childbirth: Secondary | ICD-10-CM | POA: Diagnosis present

## 2024-01-24 LAB — TYPE AND SCREEN
ABO/RH(D): O POS
Antibody Screen: NEGATIVE

## 2024-01-24 LAB — CBC
HCT: 36 % (ref 36.0–46.0)
Hemoglobin: 10.5 g/dL — ABNORMAL LOW (ref 12.0–15.0)
MCH: 20.5 pg — ABNORMAL LOW (ref 26.0–34.0)
MCHC: 29.2 g/dL — ABNORMAL LOW (ref 30.0–36.0)
MCV: 70.5 fL — ABNORMAL LOW (ref 80.0–100.0)
Platelets: 215 10*3/uL (ref 150–400)
RBC: 5.11 MIL/uL (ref 3.87–5.11)
RDW: 19.6 % — ABNORMAL HIGH (ref 11.5–15.5)
WBC: 9 10*3/uL (ref 4.0–10.5)
nRBC: 0 % (ref 0.0–0.2)

## 2024-01-24 LAB — OB RESULTS CONSOLE GBS: GBS: NEGATIVE

## 2024-01-24 MED ORDER — OXYCODONE HCL 5 MG/5ML PO SOLN: 5.0000 mg | ORAL | Status: DC | PRN

## 2024-01-24 MED ORDER — WITCH HAZEL-GLYCERIN EX PADS
1.0000 | MEDICATED_PAD | CUTANEOUS | Status: DC | PRN
  Administered 2024-01-24: 1 via TOPICAL

## 2024-01-24 MED ORDER — SIMETHICONE 80 MG PO CHEW: 80.0000 mg | CHEWABLE_TABLET | ORAL | Status: DC | PRN

## 2024-01-24 MED ORDER — ZOLPIDEM TARTRATE 5 MG PO TABS: 5.0000 mg | ORAL_TABLET | Freq: Every evening | ORAL | Status: DC | PRN

## 2024-01-24 MED ORDER — BENZOCAINE-MENTHOL 20-0.5 % EX AERO
1.0000 | INHALATION_SPRAY | CUTANEOUS | Status: DC | PRN
  Filled 2024-01-24 (×2): qty 56

## 2024-01-24 MED ORDER — ONDANSETRON HCL 4 MG/2ML IJ SOLN: 4.0000 mg | INTRAMUSCULAR | Status: DC | PRN

## 2024-01-24 MED ORDER — OXYCODONE HCL 5 MG PO TABS: 10.0000 mg | ORAL_TABLET | ORAL | Status: DC | PRN

## 2024-01-24 MED ORDER — DIPHENHYDRAMINE HCL 25 MG PO CAPS: 25.0000 mg | ORAL_CAPSULE | Freq: Four times a day (QID) | ORAL | Status: DC | PRN

## 2024-01-24 MED ORDER — IBUPROFEN 100 MG/5ML PO SUSP
600.0000 mg | Freq: Four times a day (QID) | ORAL | Status: DC
  Administered 2024-01-24 – 2024-01-26 (×6): 600 mg via ORAL
  Filled 2024-01-24 (×7): qty 30

## 2024-01-24 MED ORDER — SENNOSIDES-DOCUSATE SODIUM 8.6-50 MG PO TABS
2.0000 | ORAL_TABLET | Freq: Every day | ORAL | Status: DC
  Filled 2024-01-24 (×2): qty 2

## 2024-01-24 MED ORDER — OXYCODONE HCL 5 MG PO TABS: 5.0000 mg | ORAL_TABLET | ORAL | Status: DC | PRN

## 2024-01-24 MED ORDER — OXYCODONE-ACETAMINOPHEN 5-325 MG PO TABS: 2.0000 | ORAL_TABLET | ORAL | Status: DC | PRN

## 2024-01-24 MED ORDER — SOD CITRATE-CITRIC ACID 500-334 MG/5ML PO SOLN: 30.0000 mL | ORAL | Status: DC | PRN

## 2024-01-24 MED ORDER — OXYCODONE HCL 5 MG/5ML PO SOLN: 10.0000 mg | ORAL | Status: DC | PRN

## 2024-01-24 MED ORDER — COCONUT OIL OIL: 1.0000 | TOPICAL_OIL | Status: DC | PRN

## 2024-01-24 MED ORDER — ONDANSETRON HCL 4 MG/2ML IJ SOLN: 4.0000 mg | Freq: Four times a day (QID) | INTRAMUSCULAR | Status: DC | PRN

## 2024-01-24 MED ORDER — IBUPROFEN 600 MG PO TABS
600.0000 mg | ORAL_TABLET | Freq: Four times a day (QID) | ORAL | Status: DC
  Filled 2024-01-24: qty 1

## 2024-01-24 MED ORDER — LIDOCAINE HCL (PF) 1 % IJ SOLN
30.0000 mL | INTRAMUSCULAR | Status: AC | PRN
  Administered 2024-01-24: 30 mL via SUBCUTANEOUS
  Filled 2024-01-24: qty 30

## 2024-01-24 MED ORDER — OXYTOCIN-SODIUM CHLORIDE 30-0.9 UT/500ML-% IV SOLN
2.5000 [IU]/h | INTRAVENOUS | Status: DC
  Filled 2024-01-24: qty 500

## 2024-01-24 MED ORDER — OXYTOCIN BOLUS FROM INFUSION
333.0000 mL | Freq: Once | INTRAVENOUS | Status: AC
  Administered 2024-01-24: 333 mL via INTRAVENOUS

## 2024-01-24 MED ORDER — FLEET ENEMA RE ENEM: 1.0000 | ENEMA | RECTAL | Status: DC | PRN

## 2024-01-24 MED ORDER — COMPLETENATE 29-1 MG PO CHEW
1.0000 | CHEWABLE_TABLET | Freq: Every day | ORAL | Status: DC
  Administered 2024-01-25 – 2024-01-26 (×2): 1 via ORAL
  Filled 2024-01-24 (×2): qty 1

## 2024-01-24 MED ORDER — OXYCODONE-ACETAMINOPHEN 5-325 MG PO TABS: 1.0000 | ORAL_TABLET | ORAL | Status: DC | PRN

## 2024-01-24 MED ORDER — ACETAMINOPHEN 325 MG PO TABS: 650.0000 mg | ORAL_TABLET | ORAL | Status: DC | PRN

## 2024-01-24 MED ORDER — TETANUS-DIPHTH-ACELL PERTUSSIS 5-2.5-18.5 LF-MCG/0.5 IM SUSY
0.5000 mL | PREFILLED_SYRINGE | Freq: Once | INTRAMUSCULAR | Status: DC
  Filled 2024-01-24: qty 0.5

## 2024-01-24 MED ORDER — PRENATAL MULTIVITAMIN CH
1.0000 | ORAL_TABLET | Freq: Every day | ORAL | Status: DC
  Filled 2024-01-24: qty 1

## 2024-01-24 MED ORDER — ONDANSETRON HCL 4 MG PO TABS: 4.0000 mg | ORAL_TABLET | ORAL | Status: DC | PRN

## 2024-01-24 MED ORDER — ACETAMINOPHEN 160 MG/5ML PO SOLN: 650.0000 mg | ORAL | Status: DC | PRN

## 2024-01-24 MED ORDER — DIBUCAINE (PERIANAL) 1 % EX OINT: 1.0000 | TOPICAL_OINTMENT | CUTANEOUS | Status: DC | PRN

## 2024-01-24 NOTE — Plan of Care (Signed)

## 2024-01-24 NOTE — H&P (Signed)
 Kristina Barnett is a 23 y.o. female, G3P2012, IUP at 39.4 weeks, presenting for Active labor. H/O gon (Dx in MAU 11/15, treated, partner treated. TOC 10/24/23 negative.  Recheck at 36 weeks NEG.), anemia, false positive serology for rpr, Pt endorse + Fm. Denies vaginal leakage. Denies vaginal bleeding.  Labs:   Genetic Screen: Panoroma:LR AFP:  First Tri: Quad:  Blood Type --/--/PENDING (03/06 1056)  Rhogam    Antibody PENDING (03/06 1056)  GTT: Early: 5.4 Third Trimester: 50  Rubella: Immune (08/07 0000)  RPR: NON REACTIVE (11/15 1500)   HBsAg: NON REACTIVE (11/15 1500)  HIV: Non Reactive (11/15 1500)   GBS: Negative/-- (03/06 0000)(For PCN allergy, check sensitivities)   Chlamydia: neg  GC: neg  PAP: ???  Hgb Electrophoresis:  AA  Hgb NOB: 11    28W: 10.6     Patient Active Problem List   Diagnosis Date Noted   Normal labor 01/24/2024   Postpartum anemia 06/30/2019   Normal postpartum course 06/30/2019   Circumvallate placenta during pregnancy in third trimester, antepartum 06/28/2019   Supervision of normal pregnancy, antepartum 01/07/2019   Asymptomatic bacteriuria during pregnancy 12/09/2018   Chlamydia infection affecting pregnancy 12/09/2018     Active Ambulatory Problems    Diagnosis Date Noted   Asymptomatic bacteriuria during pregnancy 12/09/2018   Chlamydia infection affecting pregnancy 12/09/2018   Supervision of normal pregnancy, antepartum 01/07/2019   Circumvallate placenta during pregnancy in third trimester, antepartum 06/28/2019   Postpartum anemia 06/30/2019   Normal postpartum course 06/30/2019   Resolved Ambulatory Problems    Diagnosis Date Noted   No Resolved Ambulatory Problems   No Additional Past Medical History      Medications Prior to Admission  Medication Sig Dispense Refill Last Dose/Taking   metroNIDAZOLE (METROGEL) 0.75 % vaginal gel Place 1 Applicatorful vaginally at bedtime. 70 g 1    polyethylene glycol powder  (MIRALAX) 17 GM/SCOOP powder Take 255 g by mouth every three (3) days as needed for mild constipation. 255 g 0    Prenatal Vit-Fe Fumarate-FA (PRENATAL VITAMIN) 27-0.8 MG TABS Take 1 tablet by mouth daily. 30 tablet 0     History reviewed. No pertinent past medical history.   No current facility-administered medications on file prior to encounter.   Current Outpatient Medications on File Prior to Encounter  Medication Sig Dispense Refill   metroNIDAZOLE (METROGEL) 0.75 % vaginal gel Place 1 Applicatorful vaginally at bedtime. 70 g 1   polyethylene glycol powder (MIRALAX) 17 GM/SCOOP powder Take 255 g by mouth every three (3) days as needed for mild constipation. 255 g 0   Prenatal Vit-Fe Fumarate-FA (PRENATAL VITAMIN) 27-0.8 MG TABS Take 1 tablet by mouth daily. 30 tablet 0   [DISCONTINUED] ferrous sulfate 325 (65 FE) MG tablet Take 1 tablet (325 mg total) by mouth 2 (two) times daily with a meal. (Patient not taking: Reported on 10/19/2020) 60 tablet 3     No Known Allergies   OB History     Gravida  3   Para  2   Term  2   Preterm      AB  1   Living  2      SAB      IAB      Ectopic      Multiple  0   Live Births  2          History reviewed. No pertinent past medical history. Past Surgical History:  Procedure Laterality  Date   NO PAST SURGERIES     Family History: family history is not on file. Social History:  reports that she has never smoked. She has never used smokeless tobacco. She reports that she does not drink alcohol and does not use drugs.   Prenatal Transfer Tool  Maternal Diabetes: No Genetic Screening: Normal Maternal Ultrasounds/Referrals: Normal Fetal Ultrasounds or other Referrals:  None Maternal Substance Abuse:  No Significant Maternal Medications:  None Significant Maternal Lab Results: Group B Strep negative  ROS:  Review of Systems  Constitutional: Negative.   HENT: Negative.    Eyes: Negative.   Respiratory: Negative.     Cardiovascular: Negative.   Gastrointestinal:  Positive for abdominal pain.  Genitourinary: Negative.   Musculoskeletal: Negative.   Skin: Negative.   Neurological: Negative.   Endo/Heme/Allergies: Negative.   Psychiatric/Behavioral: Negative.       Physical Exam: BP 118/72   Pulse 76   Temp 97.7 F (36.5 C) (Oral)   Resp 18   Ht 5\' 7"  (1.702 m)   Wt 92.9 kg   SpO2 100%   Breastfeeding Unknown   BMI 32.09 kg/m   Physical Exam Vitals and nursing note reviewed.  Constitutional:      Appearance: Normal appearance.  HENT:     Head: Normocephalic.     Nose: Nose normal.     Mouth/Throat:     Pharynx: Oropharynx is clear.  Eyes:     Conjunctiva/sclera: Conjunctivae normal.  Cardiovascular:     Rate and Rhythm: Normal rate and regular rhythm.     Pulses: Normal pulses.     Heart sounds: Normal heart sounds.  Pulmonary:     Effort: Pulmonary effort is normal.     Breath sounds: Normal breath sounds.  Abdominal:     General: Bowel sounds are normal.  Genitourinary:    Comments: Uterus gravida Musculoskeletal:     Cervical back: Normal range of motion and neck supple.  Skin:    General: Skin is warm.     Capillary Refill: Capillary refill takes less than 2 seconds.  Neurological:     General: No focal deficit present.     Mental Status: She is alert.  Psychiatric:        Mood and Affect: Mood normal.      NST: FHR baseline 130 bpm, Variability: moderate, Accelerations:present, Decelerations:  Absent= Cat 1/Reactive UC:   irregular, every 2-5 minutes SVE:   Dilation: 10 Effacement (%): 100 Station: Plus 1, Plus 2 Exam by:: Aryonna Gunnerson, cnm, vertex verified by fetal sutures.  Leopold's: Position vertex, EFW 7lbs via leopold's.   Labs: Results for orders placed or performed during the hospital encounter of 01/24/24 (from the past 24 hours)  OB RESULT CONSOLE Group B Strep     Status: None   Collection Time: 01/24/24 12:00 AM  Result Value Ref Range   GBS  Negative   CBC     Status: Abnormal   Collection Time: 01/24/24 10:55 AM  Result Value Ref Range   WBC 9.0 4.0 - 10.5 K/uL   RBC 5.11 3.87 - 5.11 MIL/uL   Hemoglobin 10.5 (L) 12.0 - 15.0 g/dL   HCT 23.5 57.3 - 22.0 %   MCV 70.5 (L) 80.0 - 100.0 fL   MCH 20.5 (L) 26.0 - 34.0 pg   MCHC 29.2 (L) 30.0 - 36.0 g/dL   RDW 25.4 (H) 27.0 - 62.3 %   Platelets 215 150 - 400 K/uL   nRBC 0.0 0.0 -  0.2 %  Type and screen [facility]     Status: None (Preliminary result)   Collection Time: 01/24/24 10:56 AM  Result Value Ref Range   ABO/RH(D) PENDING    Antibody Screen PENDING    Sample Expiration      01/27/2024,2359 Performed at [facility], [address] [place], [place] [address]     Imaging:  No results found.  MAU Course: Orders Placed This Encounter  Procedures   OB RESULT CONSOLE Group B Strep   CBC   RPR   OB RESULTS CONSOLE Rubella Antibody   HIV Antibody (routine testing w rflx)   Diet clear liquid Room service appropriate? Yes; Fluid consistency: Thin   Vital signs   Notify physician (specify)   Activity as tolerated   Fetal monitoring per unit policy   Cervical Exam   Measure blood pressure post delivery every 15 min x 1 hour then every 30 min x 1 hour   Fundal check post delivery every 15 min x 1 hour then every 30 min x 1 hour   If Rapid HIV test positive or known HIV positive: initiate AZT orders   May in and out cath x 2 for inability to void   Insert urethral catheter X 1 PRN If Coude Catheter is chosen, qualified resources by campus can be found in the clinical skills nursing procedure for Coude Catheter 1. If straight catheterized > 2 times or patient unable to void post epidural plac...   Refer to Sidebar Report Urinary (Foley) Catheter Indications   Refer to Sidebar Report Post Indwelling Urinary Catheter Removal and Intervention Guidelines   Discontinue foley prior to vaginal delivery   Patient may have epidural upon request   Full  code   Type and screen [facility]   Insert and maintain IV Line   Admit to Inpatient (patient's expected length of stay will be greater than 2 midnights or inpatient only procedure)   Meds ordered this encounter  Medications   oxytocin (PITOCIN) IV BOLUS FROM BAG   oxytocin (PITOCIN) IV infusion 30 units in NS 500 mL - Premix   acetaminophen (TYLENOL) tablet 650 mg   oxyCODONE-acetaminophen (PERCOCET/ROXICET) 5-325 MG per tablet 1 tablet   oxyCODONE-acetaminophen (PERCOCET/ROXICET) 5-325 MG per tablet 2 tablet   sodium phosphate (FLEET) enema 1 enema   ondansetron (ZOFRAN) injection 4 mg   sodium citrate-citric acid (ORACIT) solution 30 mL   lidocaine (PF) (XYLOCAINE) 1 % injection 30 mL    Assessment/Plan: Kristina Barnett is a 23 y.o. female, G3P2012, IUP at 39.4 weeks, presenting for Active labor. H/O gon (Dx in MAU 11/15, treated, partner treated. TOC 10/24/23 negative.  Recheck at 36 weeks NEG.), anemia, false positive serology for rpr, Pt endorse + Fm. Denies vaginal leakage. Denies vaginal bleeding.  FWB: Cat 1 Fetal Tracing.   Plan: Admit to [facility]  Routine [facility] orders Pain med/epidural prn Anticipate labor progression   [facility], FNP-C, PMHNP-BC  [address]  [place], [place] [address]  Cell: [phone]  Office Phone: [phone] Fax: [phone] 01/24/2024  1:07 PM

## 2024-01-24 NOTE — MAU Note (Signed)
 Kristina Barnett is a 23 y.o. at [redacted]w[redacted]d here in MAU reporting: she's having ctxs that are 8 minutes apart since 0100-0200 this morning.  Denies VB or LOF.  Reports +FM.  LMP: NA Onset of complaint: today Pain score: 10 Vitals:   01/24/24 1013  BP: 120/72  Pulse: 99  Resp: 18  Temp: 97.7 F (36.5 C)  SpO2: 100%     FHT: 144 bpm  Lab orders placed from triage: None

## 2024-01-24 NOTE — Progress Notes (Signed)
 Just did shift assessment on patient (2nd 4 hr check).  Perineum is extremely swollen (internal mucousa of labia is actually exposed due to swelling).  Patient is getting up frequently to empty bladder.  Fundus is firm but deviated to left.  Bleeding is small, no clots.  Stressed to mom the importance of emptying bladder frequently.  Encouraged mom to get up and empty bladder every time baby is fed.   Since perineum is so incredibly swollen, I went over using the cold peripads (mom was shaking them, she did not know to pop them to activate them).  Also gave mom tucks pads, told her how to use them (she could even put them in fridge if she wanted to help with the swelling/inflammation).  Already had dermoplast spray.

## 2024-01-25 LAB — CBC
HCT: 24.7 % — ABNORMAL LOW (ref 36.0–46.0)
Hemoglobin: 7.2 g/dL — ABNORMAL LOW (ref 12.0–15.0)
MCH: 20.4 pg — ABNORMAL LOW (ref 26.0–34.0)
MCHC: 29.1 g/dL — ABNORMAL LOW (ref 30.0–36.0)
MCV: 70 fL — ABNORMAL LOW (ref 80.0–100.0)
Platelets: 181 10*3/uL (ref 150–400)
RBC: 3.53 MIL/uL — ABNORMAL LOW (ref 3.87–5.11)
RDW: 19 % — ABNORMAL HIGH (ref 11.5–15.5)
WBC: 10.2 10*3/uL (ref 4.0–10.5)
nRBC: 0 % (ref 0.0–0.2)

## 2024-01-25 LAB — RPR: RPR Ser Ql: NONREACTIVE

## 2024-01-25 NOTE — Progress Notes (Signed)
 PPD# 1 SVD w/ 2nd degree laceration Information for the patient's newborn:  Iliani, Vejar [[id number]]  female   S:   Reports feeling good Tolerating PO fluid and solids No nausea or vomiting Bleeding is light, no clots Pain controlled with acetaminophen and ibuprofen (OTC) Up ad lib / ambulatory / voiding w/o difficulty Feeding:  formula     O:   VS: BP 116/82 (BP Location: Left Arm)   Pulse 99   Temp 98.9 F (37.2 C) (Oral)   Resp 18   Ht 5\' 7"  (1.702 m)   Wt 92.9 kg   SpO2 99%   Breastfeeding Unknown   BMI 32.09 kg/m   LABS:  Recent Labs    01/24/24 1055 01/25/24 0513  WBC 9.0 10.2  HGB 10.5* 7.2*  PLT 215 181   Blood type: --/--/O POS (03/06 1056) Rubella: Immune (08/07 0000)                      I&O: Intake/Output      03/07 0701 03/08 0700   I.V. (mL/kg)    Total Intake(mL/kg)    Urine (mL/kg/hr)    Blood    Total Output    Net          Physical Exam: Alert and oriented X3 Lungs: Clear and unlabored Heart: regular rate and rhythm / no mumurs Abdomen: soft, non-tender, non-distended  Fundus: firm, non-tender, U-2 Perineum: healing, well-approximated Lochia: appropriate Extremities: no edema, negative for calf pain, tenderness, or cords    A:  PPD # 1  Normal exam  P:  Routine postpartum orders Anticipate D/C on PP day 2   Roma Schanz, DNP, CNM 01/25/2024, 7:35 PM

## 2024-01-25 NOTE — Plan of Care (Signed)

## 2024-01-26 MED ORDER — ACETAMINOPHEN 160 MG/5ML PO SOLN: 650.0000 mg | ORAL | Status: AC | PRN

## 2024-01-26 MED ORDER — IBUPROFEN 100 MG/5ML PO SUSP: 600.0000 mg | Freq: Four times a day (QID) | ORAL | 0 refills | Status: AC

## 2024-01-26 NOTE — Discharge Instructions (Signed)
 WHAT TO LOOK OUT FOR: Fever of 100.4 or above Mastitis: feels like flu and breasts hurt Infection: increased pain, swelling or redness Blood clots golf ball size or larger Postpartum depression   Congratulations on your newest addition!

## 2024-01-26 NOTE — Discharge Summary (Signed)
 Postpartum Discharge Summary  Date of Service updated 01/26/24    Patient Name: Kristina Barnett DOB: 2001-06-12 MRN: [id number]  Date of admission: 01/24/2024 Delivery date:01/24/2024 Delivering provider: Dale Harrold Date of discharge: 01/26/2024  Admitting diagnosis: Normal labor [O80, Z37.9] Intrauterine pregnancy: [redacted]w[redacted]d     Secondary diagnosis:  Principal Problem:   Normal labor  Additional problems: none    Discharge diagnosis: Term Pregnancy Delivered                                              Post partum procedures: none Augmentation: N/A Complications: None  Hospital course: Onset of Labor With Vaginal Delivery      23 y.o. yo [id number] at [redacted]w[redacted]d was admitted in Active Labor on 01/24/2024. Labor course was uncomplicated  Membrane Rupture Time/Date: 11:04 AM,01/24/2024  Delivery Method:Vaginal, Spontaneous Operative Delivery:N/A Episiotomy: None Lacerations:  2nd degree;Perineal Patient had a postpartum course complicated by none.  She is ambulating, tolerating a regular diet, passing flatus, and urinating well. Patient is discharged home in stable condition on 01/26/24.  Newborn Data: Birth date:01/24/2024 Birth time:11:49 AM Gender:Female Living status:Living Apgars:9 ,9  Weight:3350 g  Magnesium Sulfate received: No BMZ received: No Rhophylac:N/A MMR:N/A Transfusion:No Immunizations administered: Immunization History  Administered Date(s) Administered   Influenza,inj,Quad PF,6+ Mos 01/07/2019   PPD Test 08/02/2018    Physical exam  Vitals:   01/25/24 0301 01/25/24 1515 01/25/24 2130 01/26/24 0420  BP: 112/70 116/82 124/79 108/75  Pulse: 80 99 96 87  Resp: 18 18 18 18   Temp: 98.2 F (36.8 C) 98.9 F (37.2 C) 98.2 F (36.8 C) 98.4 F (36.9 C)  TempSrc: Oral Oral Oral Oral  SpO2: 100% 99% 99% 99%  Weight:      Height:       General: alert, cooperative, and no distress Lochia: appropriate Uterine Fundus: firm Incision: N/A DVT Evaluation: No  evidence of DVT seen on physical exam. No cords or calf tenderness. No significant calf/ankle edema. Labs: Lab Results  Component Value Date   WBC 10.2 01/25/2024   HGB 7.2 (L) 01/25/2024   HCT 24.7 (L) 01/25/2024   MCV 70.0 (L) 01/25/2024   PLT 181 01/25/2024      Latest Ref Rng & Units 07/18/2021    9:15 AM  CMP  Glucose 70 - 99 mg/dL 95   BUN 6 - 20 mg/dL 7   Creatinine 1.47 - 8.29 mg/dL 5.62   Sodium 130 - 865 mmol/L 136   Potassium 3.5 - 5.1 mmol/L 3.5   Chloride 98 - 111 mmol/L 105   CO2 22 - 32 mmol/L 23   Calcium 8.9 - 10.3 mg/dL 9.3   Total Protein 6.5 - 8.1 g/dL 8.0   Total Bilirubin 0.3 - 1.2 mg/dL 0.8   Alkaline Phos 38 - 126 U/L 42   AST 15 - 41 U/L 21   ALT 0 - 44 U/L 12    Edinburgh Score:    01/24/2024    2:45 PM  Edinburgh Postnatal Depression Scale Screening Tool  I have been able to laugh and see the funny side of things. 0  I have looked forward with enjoyment to things. 0  I have blamed myself unnecessarily when things went wrong. 1  I have been anxious or worried for no good reason. 2  I have felt scared or panicky for  no good reason. 0  Things have been getting on top of me. 1  I have been so unhappy that I have had difficulty sleeping. 0  I have felt sad or miserable. 1  I have been so unhappy that I have been crying. 1  The thought of harming myself has occurred to me. 0  Edinburgh Postnatal Depression Scale Total 6      After visit meds:  Allergies as of 01/26/2024   No Known Allergies      Medication List     STOP taking these medications    metroNIDAZOLE 0.75 % vaginal gel Commonly known as: METROGEL       TAKE these medications    acetaminophen 160 MG/5ML solution Commonly known as: TYLENOL Take 20.3 mLs (650 mg total) by mouth every 4 (four) hours as needed (For pain score < 4).   ibuprofen 100 MG/5ML suspension Commonly known as: ADVIL Take 30 mLs (600 mg total) by mouth every 6 (six) hours for 15 days.    polyethylene glycol powder 17 GM/SCOOP powder Commonly known as: MiraLax Take 255 g by mouth every three (3) days as needed for mild constipation.   Prenatal Vitamin 27-0.8 MG Tabs Take 1 tablet by mouth daily.         Discharge home in stable condition Infant Feeding:  formula Infant Disposition:home with mother Discharge instruction: per After Visit Summary and Postpartum booklet. Activity: Advance as tolerated. Pelvic rest for 6 weeks.  Diet: routine diet Anticipated Birth Control:  natural family planning Postpartum Appointment:6 weeks Additional Postpartum F/U:  none Future Appointments:No future appointments. Follow up Visit:  Follow-up Information     [facility]. Schedule an appointment as soon as possible for a visit in 6 week(s).   Specialty: Obstetrics and Gynecology Contact information: [address] [place] [address] [phone]                    01/26/2024 Roma Schanz, CNM

## 2024-01-29 ENCOUNTER — Encounter (HOSPITAL_COMMUNITY): Payer: Self-pay | Admitting: Emergency Medicine

## 2024-01-29 ENCOUNTER — Emergency Department (HOSPITAL_COMMUNITY)

## 2024-01-29 ENCOUNTER — Inpatient Hospital Stay (HOSPITAL_COMMUNITY)

## 2024-01-29 ENCOUNTER — Other Ambulatory Visit: Payer: Self-pay

## 2024-01-29 ENCOUNTER — Inpatient Hospital Stay (HOSPITAL_COMMUNITY)
Admission: EM | Admit: 2024-01-29 | Discharge: 2024-02-02 | DRG: 776 | Disposition: A | Discharge status: Home / Self Care | Attending: Obstetrics and Gynecology | Admitting: Obstetrics and Gynecology

## 2024-01-29 DIAGNOSIS — O9943 Diseases of the circulatory system complicating the puerperium: Secondary | ICD-10-CM | POA: Diagnosis present

## 2024-01-29 DIAGNOSIS — J189 Pneumonia, unspecified organism: Secondary | ICD-10-CM | POA: Diagnosis present

## 2024-01-29 DIAGNOSIS — J810 Acute pulmonary edema: Secondary | ICD-10-CM | POA: Diagnosis present

## 2024-01-29 DIAGNOSIS — O9081 Anemia of the puerperium: Secondary | ICD-10-CM | POA: Diagnosis present

## 2024-01-29 DIAGNOSIS — O9953 Diseases of the respiratory system complicating the puerperium: Secondary | ICD-10-CM | POA: Diagnosis present

## 2024-01-29 DIAGNOSIS — D509 Iron deficiency anemia, unspecified: Secondary | ICD-10-CM | POA: Diagnosis present

## 2024-01-29 DIAGNOSIS — I517 Cardiomegaly: Secondary | ICD-10-CM | POA: Diagnosis present

## 2024-01-29 DIAGNOSIS — O8612 Endometritis following delivery: Principal | ICD-10-CM | POA: Diagnosis present

## 2024-01-29 DIAGNOSIS — B9620 Unspecified Escherichia coli [E. coli] as the cause of diseases classified elsewhere: Secondary | ICD-10-CM | POA: Diagnosis present

## 2024-01-29 DIAGNOSIS — D649 Anemia, unspecified: Principal | ICD-10-CM | POA: Diagnosis present

## 2024-01-29 DIAGNOSIS — R509 Fever, unspecified: Secondary | ICD-10-CM | POA: Diagnosis present

## 2024-01-29 DIAGNOSIS — R109 Unspecified abdominal pain: Secondary | ICD-10-CM | POA: Diagnosis present

## 2024-01-29 DIAGNOSIS — O9279 Other disorders of lactation: Secondary | ICD-10-CM | POA: Diagnosis present

## 2024-01-29 DIAGNOSIS — R0609 Other forms of dyspnea: Secondary | ICD-10-CM | POA: Diagnosis not present

## 2024-01-29 LAB — CBC WITH DIFFERENTIAL/PLATELET
Abs Immature Granulocytes: 0.08 10*3/uL — ABNORMAL HIGH (ref 0.00–0.07)
Abs Immature Granulocytes: 0 10*3/uL (ref 0.00–0.07)
Basophils Absolute: 0 10*3/uL (ref 0.0–0.1)
Basophils Absolute: 0 10*3/uL (ref 0.0–0.1)
Basophils Relative: 0 %
Basophils Relative: 0 %
Eosinophils Absolute: 0 10*3/uL (ref 0.0–0.5)
Eosinophils Absolute: 0 10*3/uL (ref 0.0–0.5)
Eosinophils Relative: 1 %
Eosinophils Relative: 0 %
HCT: 28.9 % — ABNORMAL LOW (ref 36.0–46.0)
HCT: 25 % — ABNORMAL LOW (ref 36.0–46.0)
Hemoglobin: 8.2 g/dL — ABNORMAL LOW (ref 12.0–15.0)
Hemoglobin: 7.3 g/dL — ABNORMAL LOW (ref 12.0–15.0)
Immature Granulocytes: 2 %
Lymphocytes Relative: 14 %
Lymphocytes Relative: 5 %
Lymphs Abs: 0.6 10*3/uL — ABNORMAL LOW (ref 0.7–4.0)
Lymphs Abs: 0.4 10*3/uL — ABNORMAL LOW (ref 0.7–4.0)
MCH: 21.2 pg — ABNORMAL LOW (ref 26.0–34.0)
MCH: 21 pg — ABNORMAL LOW (ref 26.0–34.0)
MCHC: 28.4 g/dL — ABNORMAL LOW (ref 30.0–36.0)
MCHC: 29.2 g/dL — ABNORMAL LOW (ref 30.0–36.0)
MCV: 74.7 fL — ABNORMAL LOW (ref 80.0–100.0)
MCV: 72 fL — ABNORMAL LOW (ref 80.0–100.0)
Monocytes Absolute: 0.1 10*3/uL (ref 0.1–1.0)
Monocytes Absolute: 0 10*3/uL — ABNORMAL LOW (ref 0.1–1.0)
Monocytes Relative: 1 %
Monocytes Relative: 0 %
Neutro Abs: 3.8 10*3/uL (ref 1.7–7.7)
Neutro Abs: 6.8 10*3/uL (ref 1.7–7.7)
Neutrophils Relative %: 82 %
Neutrophils Relative %: 95 %
Platelets: 209 10*3/uL (ref 150–400)
Platelets: 181 10*3/uL (ref 150–400)
RBC: 3.87 MIL/uL (ref 3.87–5.11)
RBC: 3.47 MIL/uL — ABNORMAL LOW (ref 3.87–5.11)
RDW: 20.4 % — ABNORMAL HIGH (ref 11.5–15.5)
RDW: 19.9 % — ABNORMAL HIGH (ref 11.5–15.5)
WBC: 4.6 10*3/uL (ref 4.0–10.5)
WBC: 7.2 10*3/uL (ref 4.0–10.5)
nRBC: 0.6 % — ABNORMAL HIGH (ref 0.0–0.2)
nRBC: 0 % (ref 0.0–0.2)
nRBC: 0 /100{WBCs}

## 2024-01-29 LAB — CBC
HCT: 29 % — ABNORMAL LOW (ref 36.0–46.0)
Hemoglobin: 9.2 g/dL — ABNORMAL LOW (ref 12.0–15.0)
MCH: 23.8 pg — ABNORMAL LOW (ref 26.0–34.0)
MCHC: 31.7 g/dL (ref 30.0–36.0)
MCV: 75.1 fL — ABNORMAL LOW (ref 80.0–100.0)
Platelets: 151 10*3/uL (ref 150–400)
RBC: 3.86 MIL/uL — ABNORMAL LOW (ref 3.87–5.11)
RDW: 22.2 % — ABNORMAL HIGH (ref 11.5–15.5)
WBC: 7.8 10*3/uL (ref 4.0–10.5)
nRBC: 0.8 % — ABNORMAL HIGH (ref 0.0–0.2)

## 2024-01-29 LAB — BLOOD CULTURE ID PANEL (REFLEXED) - BCID2

## 2024-01-29 LAB — LACTATE DEHYDROGENASE: LDH: 252 U/L — ABNORMAL HIGH (ref 98–192)

## 2024-01-29 LAB — URINALYSIS, W/ REFLEX TO CULTURE (INFECTION SUSPECTED)
Bilirubin Urine: NEGATIVE
Glucose, UA: NEGATIVE mg/dL
Ketones, ur: NEGATIVE mg/dL
Nitrite: NEGATIVE
Protein, ur: 100 mg/dL — AB
RBC / HPF: >50 RBC/hpf (ref 0–5)
Specific Gravity, Urine: 1.01 (ref 1.005–1.030)
WBC, UA: >50 WBC/hpf (ref 0–5)
pH: 5 (ref 5.0–8.0)

## 2024-01-29 LAB — I-STAT CG4 LACTIC ACID, ED: Lactic Acid, Venous: 2.3 mmol/L (ref 0.5–1.9)

## 2024-01-29 LAB — COMPREHENSIVE METABOLIC PANEL
ALT: 41 U/L (ref 0–44)
ALT: 32 U/L (ref 0–44)
ALT: 33 U/L (ref 0–44)
AST: 59 U/L — ABNORMAL HIGH (ref 15–41)
AST: 43 U/L — ABNORMAL HIGH (ref 15–41)
AST: 44 U/L — ABNORMAL HIGH (ref 15–41)
Albumin: 2.6 g/dL — ABNORMAL LOW (ref 3.5–5.0)
Albumin: 2.1 g/dL — ABNORMAL LOW (ref 3.5–5.0)
Albumin: 2.1 g/dL — ABNORMAL LOW (ref 3.5–5.0)
Alkaline Phosphatase: 90 U/L (ref 38–126)
Alkaline Phosphatase: 67 U/L (ref 38–126)
Alkaline Phosphatase: 60 U/L (ref 38–126)
Anion gap: 15 (ref 5–15)
Anion gap: 10 (ref 5–15)
Anion gap: 5 (ref 5–15)
BUN: 9 mg/dL (ref 6–20)
BUN: 8 mg/dL (ref 6–20)
BUN: 5 mg/dL — ABNORMAL LOW (ref 6–20)
CO2: 15 mmol/L — ABNORMAL LOW (ref 22–32)
CO2: 18 mmol/L — ABNORMAL LOW (ref 22–32)
CO2: 22 mmol/L (ref 22–32)
Calcium: 8.5 mg/dL — ABNORMAL LOW (ref 8.9–10.3)
Calcium: 8.1 mg/dL — ABNORMAL LOW (ref 8.9–10.3)
Calcium: 8.3 mg/dL — ABNORMAL LOW (ref 8.9–10.3)
Chloride: 105 mmol/L (ref 98–111)
Chloride: 108 mmol/L (ref 98–111)
Chloride: 110 mmol/L (ref 98–111)
Creatinine, Ser: 0.73 mg/dL (ref 0.44–1.00)
Creatinine, Ser: 0.78 mg/dL (ref 0.44–1.00)
Creatinine, Ser: 0.77 mg/dL (ref 0.44–1.00)
GFR, Estimated: >60 mL/min (ref >60)
GFR, Estimated: >60 mL/min (ref >60)
GFR, Estimated: >60 mL/min (ref >60)
Glucose, Bld: 103 mg/dL — ABNORMAL HIGH (ref 70–99)
Glucose, Bld: 113 mg/dL — ABNORMAL HIGH (ref 70–99)
Glucose, Bld: 110 mg/dL — ABNORMAL HIGH (ref 70–99)
Potassium: 3.6 mmol/L (ref 3.5–5.1)
Potassium: 3.2 mmol/L — ABNORMAL LOW (ref 3.5–5.1)
Potassium: 3.7 mmol/L (ref 3.5–5.1)
Sodium: 135 mmol/L (ref 135–145)
Sodium: 136 mmol/L (ref 135–145)
Sodium: 137 mmol/L (ref 135–145)
Total Bilirubin: 0.7 mg/dL (ref 0.0–1.2)
Total Bilirubin: 0.5 mg/dL (ref 0.0–1.2)
Total Bilirubin: 0.5 mg/dL (ref 0.0–1.2)
Total Protein: 6.9 g/dL (ref 6.5–8.1)
Total Protein: 5.7 g/dL — ABNORMAL LOW (ref 6.5–8.1)
Total Protein: 6 g/dL — ABNORMAL LOW (ref 6.5–8.1)

## 2024-01-29 LAB — PROTIME-INR
INR: 1.2 (ref 0.8–1.2)
Prothrombin Time: 15 s (ref 11.4–15.2)

## 2024-01-29 LAB — LACTIC ACID, PLASMA: Lactic Acid, Venous: 2.2 mmol/L (ref 0.5–1.9)

## 2024-01-29 LAB — HCG, SERUM, QUALITATIVE: Preg, Serum: POSITIVE — AB

## 2024-01-29 LAB — APTT: aPTT: 34 s (ref 24–36)

## 2024-01-29 LAB — URIC ACID: Uric Acid, Serum: 2.5 mg/dL (ref 2.5–7.1)

## 2024-01-29 LAB — PREPARE RBC (CROSSMATCH)

## 2024-01-29 MED ORDER — LACTATED RINGERS IV BOLUS
1000.0000 mL | Freq: Once | INTRAVENOUS | Status: AC
  Administered 2024-01-29: 1000 mL via INTRAVENOUS

## 2024-01-29 MED ORDER — FUROSEMIDE 10 MG/ML IJ SOLN
20.0000 mg | Freq: Once | INTRAMUSCULAR | Status: AC
  Administered 2024-01-29: 20 mg via INTRAVENOUS
  Filled 2024-01-29: qty 2

## 2024-01-29 MED ORDER — ZOLPIDEM TARTRATE 5 MG PO TABS: 5.0000 mg | ORAL_TABLET | Freq: Every evening | ORAL | Status: DC | PRN

## 2024-01-29 MED ORDER — BENZOCAINE-MENTHOL 20-0.5 % EX AERO: 1.0000 | INHALATION_SPRAY | CUTANEOUS | Status: DC | PRN

## 2024-01-29 MED ORDER — SIMETHICONE 80 MG PO CHEW: 80.0000 mg | CHEWABLE_TABLET | ORAL | Status: DC | PRN

## 2024-01-29 MED ORDER — ACETAMINOPHEN 160 MG/5ML PO SOLN
650.0000 mg | ORAL | Status: DC | PRN
  Administered 2024-01-29 – 2024-02-02 (×7): 650 mg via ORAL
  Filled 2024-01-29 (×7): qty 20.3

## 2024-01-29 MED ORDER — WITCH HAZEL-GLYCERIN EX PADS: 1.0000 | MEDICATED_PAD | CUTANEOUS | Status: DC | PRN

## 2024-01-29 MED ORDER — SODIUM CHLORIDE 0.9% IV SOLUTION: Freq: Once | INTRAVENOUS | Status: AC

## 2024-01-29 MED ORDER — OXYCODONE HCL 5 MG PO TABS: 5.0000 mg | ORAL_TABLET | ORAL | Status: DC | PRN

## 2024-01-29 MED ORDER — LACTATED RINGERS IV SOLN: INTRAVENOUS | Status: AC

## 2024-01-29 MED ORDER — OXYCODONE HCL 5 MG PO TABS: 10.0000 mg | ORAL_TABLET | ORAL | Status: DC | PRN

## 2024-01-29 MED ORDER — SENNOSIDES-DOCUSATE SODIUM 8.6-50 MG PO TABS
2.0000 | ORAL_TABLET | Freq: Every day | ORAL | Status: DC
  Filled 2024-01-29 (×3): qty 2

## 2024-01-29 MED ORDER — ONDANSETRON HCL 4 MG PO TABS: 4.0000 mg | ORAL_TABLET | ORAL | Status: DC | PRN

## 2024-01-29 MED ORDER — ACETAMINOPHEN 325 MG PO TABS: 650.0000 mg | ORAL_TABLET | ORAL | Status: DC | PRN

## 2024-01-29 MED ORDER — IOHEXOL 350 MG/ML SOLN
75.0000 mL | Freq: Once | INTRAVENOUS | Status: AC | PRN
  Administered 2024-01-29: 75 mL via INTRAVENOUS

## 2024-01-29 MED ORDER — ACETAMINOPHEN 160 MG/5ML PO SOLN
1000.0000 mg | ORAL | Status: AC
  Administered 2024-01-29: 1000 mg via ORAL
  Filled 2024-01-29: qty 40.6

## 2024-01-29 MED ORDER — KETOROLAC TROMETHAMINE 30 MG/ML IJ SOLN
30.0000 mg | Freq: Four times a day (QID) | INTRAMUSCULAR | Status: AC
  Administered 2024-01-29 – 2024-02-02 (×16): 30 mg via INTRAVENOUS
  Filled 2024-01-29 (×16): qty 1

## 2024-01-29 MED ORDER — DIPHENHYDRAMINE HCL 12.5 MG/5ML PO ELIX
25.0000 mg | ORAL_SOLUTION | Freq: Once | ORAL | Status: AC
  Administered 2024-01-29: 25 mg via ORAL
  Filled 2024-01-29: qty 10

## 2024-01-29 MED ORDER — IBUPROFEN 600 MG PO TABS: 600.0000 mg | ORAL_TABLET | Freq: Four times a day (QID) | ORAL | Status: DC

## 2024-01-29 MED ORDER — ONDANSETRON HCL 4 MG/2ML IJ SOLN: 4.0000 mg | INTRAMUSCULAR | Status: DC | PRN

## 2024-01-29 MED ORDER — TETANUS-DIPHTH-ACELL PERTUSSIS 5-2.5-18.5 LF-MCG/0.5 IM SUSY: 0.5000 mL | PREFILLED_SYRINGE | Freq: Once | INTRAMUSCULAR | Status: DC

## 2024-01-29 MED ORDER — DIPHENHYDRAMINE HCL 12.5 MG/5ML PO ELIX: 25.0000 mg | ORAL_SOLUTION | Freq: Four times a day (QID) | ORAL | Status: DC | PRN

## 2024-01-29 MED ORDER — ACETAMINOPHEN-CAFFEINE 500-65 MG PO TABS
2.0000 | ORAL_TABLET | Freq: Once | ORAL | Status: DC
  Filled 2024-01-29: qty 2

## 2024-01-29 MED ORDER — SODIUM CHLORIDE 0.9 % IV SOLN
500.0000 mg | Freq: Once | INTRAVENOUS | Status: DC
  Filled 2024-01-29: qty 25

## 2024-01-29 MED ORDER — COCONUT OIL OIL
1.0000 | TOPICAL_OIL | Status: DC | PRN
Start: 2024-01-29 — End: 2024-02-02

## 2024-01-29 MED ORDER — OXYCODONE HCL 5 MG/5ML PO SOLN: 5.0000 mg | ORAL | Status: DC | PRN

## 2024-01-29 MED ORDER — SODIUM CHLORIDE 0.9 % IV SOLN
3.0000 g | Freq: Four times a day (QID) | INTRAVENOUS | Status: DC
  Administered 2024-01-29 – 2024-02-02 (×17): 3 g via INTRAVENOUS
  Filled 2024-01-29 (×17): qty 8

## 2024-01-29 MED ORDER — DIBUCAINE (PERIANAL) 1 % EX OINT: 1.0000 | TOPICAL_OINTMENT | CUTANEOUS | Status: DC | PRN

## 2024-01-29 MED ORDER — PRENATAL MULTIVITAMIN CH
1.0000 | ORAL_TABLET | Freq: Every day | ORAL | Status: DC
  Filled 2024-01-29: qty 1

## 2024-01-29 MED ORDER — LACTATED RINGERS IV BOLUS
500.0000 mL | Freq: Once | INTRAVENOUS | Status: AC
  Administered 2024-01-29: 500 mL via INTRAVENOUS

## 2024-01-29 MED ORDER — DIPHENHYDRAMINE HCL 50 MG/ML IJ SOLN
25.0000 mg | Freq: Once | INTRAMUSCULAR | Status: AC
Start: 2024-01-29 — End: 2024-01-29
  Administered 2024-01-29: 25 mg via INTRAVENOUS
  Filled 2024-01-29: qty 1

## 2024-01-29 MED ORDER — ACETAMINOPHEN 160 MG/5ML PO SOLN
650.0000 mg | Freq: Once | ORAL | Status: AC
  Administered 2024-01-29: 650 mg via ORAL
  Filled 2024-01-29: qty 20.3

## 2024-01-29 MED ORDER — SODIUM CHLORIDE 0.9 % IV SOLN: 100.0000 mg | Freq: Once | INTRAVENOUS | Status: DC

## 2024-01-29 MED ORDER — METOCLOPRAMIDE HCL 5 MG/ML IJ SOLN
10.0000 mg | Freq: Once | INTRAMUSCULAR | Status: AC
  Administered 2024-01-29: 10 mg via INTRAVENOUS
  Filled 2024-01-29: qty 2

## 2024-01-29 MED ORDER — OXYCODONE HCL 5 MG/5ML PO SOLN: 10.0000 mg | ORAL | Status: DC | PRN

## 2024-01-29 MED ORDER — DIPHENHYDRAMINE HCL 25 MG PO CAPS: 25.0000 mg | ORAL_CAPSULE | Freq: Four times a day (QID) | ORAL | Status: DC | PRN

## 2024-01-29 NOTE — ED Provider Notes (Signed)
 Patient 4 days postpartum brought in by EMS complaining of headache, chills, fever, abdominal pain, vaginal bleeding, nausea, vomiting.  Lab work initiated.  Spoke with the MAU provider who agreed to accept patient to MAU for further evaluation.   Pamala Duffel 01/29/24 0059    Nira Conn, MD 01/29/24 [phone]

## 2024-01-29 NOTE — Progress Notes (Signed)
 PHARMACY - PHYSICIAN COMMUNICATION CRITICAL VALUE ALERT - BLOOD CULTURE IDENTIFICATION (BCID)  Kristina Barnett is an 23 y.o. female who presented to [facility] on 01/29/2024 with a chief complaint of abdominal pain s/p vaginal delivery on 01-24-24.  Assessment:  1/4 blood cultures staph species. Likely contaminant (include suspected source if known)  Name of physician (or Provider) Contacted: Hoover Browns  Current antibiotics: Unasyn   Changes to prescribed antibiotics recommended:  Patient is on recommended antibiotics - No changes needed until further sensitivities resulted. Likely contaminant. Will need to change to staph coverage if necessary.  Results for orders placed or performed during the hospital encounter of 01/29/24  Blood Culture ID Panel (Reflexed) (Collected: 01/29/2024  1:02 AM)  Result Value Ref Range   Enterococcus faecalis NOT DETECTED NOT DETECTED   Enterococcus Faecium NOT DETECTED NOT DETECTED   Listeria monocytogenes NOT DETECTED NOT DETECTED   Staphylococcus species DETECTED (A) NOT DETECTED   Staphylococcus aureus (BCID) NOT DETECTED NOT DETECTED   Staphylococcus epidermidis NOT DETECTED NOT DETECTED   Staphylococcus lugdunensis NOT DETECTED NOT DETECTED   Streptococcus species NOT DETECTED NOT DETECTED   Streptococcus agalactiae NOT DETECTED NOT DETECTED   Streptococcus pneumoniae NOT DETECTED NOT DETECTED   Streptococcus pyogenes NOT DETECTED NOT DETECTED   A.calcoaceticus-baumannii NOT DETECTED NOT DETECTED   Bacteroides fragilis NOT DETECTED NOT DETECTED   Enterobacterales NOT DETECTED NOT DETECTED   Enterobacter cloacae complex NOT DETECTED NOT DETECTED   Escherichia coli NOT DETECTED NOT DETECTED   Klebsiella aerogenes NOT DETECTED NOT DETECTED   Klebsiella oxytoca NOT DETECTED NOT DETECTED   Klebsiella pneumoniae NOT DETECTED NOT DETECTED   Proteus species NOT DETECTED NOT DETECTED   Salmonella species NOT DETECTED NOT DETECTED   Serratia  marcescens NOT DETECTED NOT DETECTED   Haemophilus influenzae NOT DETECTED NOT DETECTED   Neisseria meningitidis NOT DETECTED NOT DETECTED   Pseudomonas aeruginosa NOT DETECTED NOT DETECTED   Stenotrophomonas maltophilia NOT DETECTED NOT DETECTED   Candida albicans NOT DETECTED NOT DETECTED   Candida auris NOT DETECTED NOT DETECTED   Candida glabrata NOT DETECTED NOT DETECTED   Candida krusei NOT DETECTED NOT DETECTED   Candida parapsilosis NOT DETECTED NOT DETECTED   Candida tropicalis NOT DETECTED NOT DETECTED   Cryptococcus neoformans/gattii NOT DETECTED NOT DETECTED    Claybon Jabs 01/29/2024  9:36 PM

## 2024-01-29 NOTE — ED Triage Notes (Signed)
 Pt BIB EMS from home, gave birth 4 days ago. Headache, tingling in arms and legs, chills, fever, abd pain, vaginal bleeding, nausea and vomiting. Denies complications with pregnancy.  EMS VS: 140/88 HR 116 98% RA

## 2024-01-29 NOTE — H&P (Incomplete)
 Kristina Barnett is an 23 y.o. female. Pt presented by EMS c/o abdominal pain and was found to have a fever to 100.8 with elevated lactic acid.  MAu called me recommending admission for IV fluids and venofer.  Pt admitted and upon evaluation, she no longer had the severe abdominal pain but was shaking with chills uncontrollable and shortly thereafter spiked a temp > 101.  She is s/p NSVD last week.  Pertinent Gynecological History: H/o regular cycles, s/p nsvd.  Menstrual History:  No LMP recorded.    History reviewed. No pertinent past medical history.  Past Surgical History:  Procedure Laterality Date   NO PAST SURGERIES      History reviewed. No pertinent family history.  Social History:  reports that she has never smoked. She has never used smokeless tobacco. She reports that she does not drink alcohol and does not use drugs.  Allergies: No Known Allergies  Medications Prior to Admission  Medication Sig Dispense Refill Last Dose/Taking   acetaminophen (TYLENOL) 160 MG/5ML solution Take 20.3 mLs (650 mg total) by mouth every 4 (four) hours as needed (For pain score < 4).   01/27/2024   ibuprofen (ADVIL) 100 MG/5ML suspension Take 30 mLs (600 mg total) by mouth every 6 (six) hours for 15 days. 1800 mL 0    polyethylene glycol powder (MIRALAX) 17 GM/SCOOP powder Take 255 g by mouth every three (3) days as needed for mild constipation. 255 g 0    Prenatal Vit-Fe Fumarate-FA (PRENATAL VITAMIN) 27-0.8 MG TABS Take 1 tablet by mouth daily. 30 tablet 0     Review of Systems  Blood pressure (!) 136/95, pulse 99, temperature 98.2 F (36.8 C), temperature source Oral, resp. rate (!) 22, height 5\' 7"  (1.702 m), weight 87.3 kg, SpO2 96%, unknown if currently breastfeeding. Physical Exam  Lungs unlabored CV RRR Abdomen FF, NT Extremities no calf tenderness Lochia appropriate  Results for orders placed or performed during the hospital encounter of 01/29/24 (from the past 24 hours)   Comprehensive metabolic panel     Status: Abnormal   Collection Time: 01/29/24  1:14 AM  Result Value Ref Range   Sodium 135 135 - 145 mmol/L   Potassium 3.6 3.5 - 5.1 mmol/L   Chloride 105 98 - 111 mmol/L   CO2 15 (L) 22 - 32 mmol/L   Glucose, Bld 103 (H) 70 - 99 mg/dL   BUN 9 6 - 20 mg/dL   Creatinine, Ser 5.40 0.44 - 1.00 mg/dL   Calcium 8.5 (L) 8.9 - 10.3 mg/dL   Total Protein 6.9 6.5 - 8.1 g/dL   Albumin 2.6 (L) 3.5 - 5.0 g/dL   AST 59 (H) 15 - 41 U/L   ALT 41 0 - 44 U/L   Alkaline Phosphatase 90 38 - 126 U/L   Total Bilirubin 0.7 0.0 - 1.2 mg/dL   GFR, Estimated >98 >11 mL/min   Anion gap 15 5 - 15  CBC with Differential     Status: Abnormal   Collection Time: 01/29/24  1:14 AM  Result Value Ref Range   WBC 4.6 4.0 - 10.5 K/uL   RBC 3.87 3.87 - 5.11 MIL/uL   Hemoglobin 8.2 (L) 12.0 - 15.0 g/dL   HCT 91.4 (L) 78.2 - 95.6 %   MCV 74.7 (L) 80.0 - 100.0 fL   MCH 21.2 (L) 26.0 - 34.0 pg   MCHC 28.4 (L) 30.0 - 36.0 g/dL   RDW 21.3 (H) 08.6 - 57.8 %  Platelets 209 150 - 400 K/uL   nRBC 0.6 (H) 0.0 - 0.2 %   Neutrophils Relative % 82 %   Neutro Abs 3.8 1.7 - 7.7 K/uL   Lymphocytes Relative 14 %   Lymphs Abs 0.6 (L) 0.7 - 4.0 K/uL   Monocytes Relative 1 %   Monocytes Absolute 0.1 0.1 - 1.0 K/uL   Eosinophils Relative 1 %   Eosinophils Absolute 0.0 0.0 - 0.5 K/uL   Basophils Relative 0 %   Basophils Absolute 0.0 0.0 - 0.1 K/uL   Immature Granulocytes 2 %   Abs Immature Granulocytes 0.08 (H) 0.00 - 0.07 K/uL  Protime-INR     Status: None   Collection Time: 01/29/24  1:14 AM  Result Value Ref Range   Prothrombin Time 15.0 11.4 - 15.2 seconds   INR 1.2 0.8 - 1.2  APTT     Status: None   Collection Time: 01/29/24  1:14 AM  Result Value Ref Range   aPTT 34 24 - 36 seconds  hCG, serum, qualitative     Status: Abnormal   Collection Time: 01/29/24  1:14 AM  Result Value Ref Range   Preg, Serum POSITIVE (A) NEGATIVE  I-Stat Lactic Acid, ED     Status: Abnormal    Collection Time: 01/29/24  1:21 AM  Result Value Ref Range   Lactic Acid, Venous 2.3 (HH) 0.5 - 1.9 mmol/L   Comment NOTIFIED PHYSICIAN   Comprehensive metabolic panel     Status: Abnormal   Collection Time: 01/29/24  3:09 AM  Result Value Ref Range   Sodium 136 135 - 145 mmol/L   Potassium 3.2 (L) 3.5 - 5.1 mmol/L   Chloride 108 98 - 111 mmol/L   CO2 18 (L) 22 - 32 mmol/L   Glucose, Bld 113 (H) 70 - 99 mg/dL   BUN 8 6 - 20 mg/dL   Creatinine, Ser 1.61 0.44 - 1.00 mg/dL   Calcium 8.1 (L) 8.9 - 10.3 mg/dL   Total Protein 5.7 (L) 6.5 - 8.1 g/dL   Albumin 2.1 (L) 3.5 - 5.0 g/dL   AST 43 (H) 15 - 41 U/L   ALT 32 0 - 44 U/L   Alkaline Phosphatase 67 38 - 126 U/L   Total Bilirubin 0.5 0.0 - 1.2 mg/dL   GFR, Estimated >09 >60 mL/min   Anion gap 10 5 - 15  Lactic acid, plasma     Status: Abnormal   Collection Time: 01/29/24  3:09 AM  Result Value Ref Range   Lactic Acid, Venous 2.2 (HH) 0.5 - 1.9 mmol/L  Urinalysis, w/ Reflex to Culture (Infection Suspected) -Urine, Clean Catch     Status: Abnormal   Collection Time: 01/29/24  6:25 AM  Result Value Ref Range   Specimen Source URINE, CLEAN CATCH    Color, Urine YELLOW YELLOW   APPearance HAZY (A) CLEAR   Specific Gravity, Urine 1.010 1.005 - 1.030   pH 5.0 5.0 - 8.0   Glucose, UA NEGATIVE NEGATIVE mg/dL   Hgb urine dipstick LARGE (A) NEGATIVE   Bilirubin Urine NEGATIVE NEGATIVE   Ketones, ur NEGATIVE NEGATIVE mg/dL   Protein, ur 454 (A) NEGATIVE mg/dL   Nitrite NEGATIVE NEGATIVE   Leukocytes,Ua LARGE (A) NEGATIVE   RBC / HPF >50 0 - 5 RBC/hpf   WBC, UA >50 0 - 5 WBC/hpf   Bacteria, UA RARE (A) NONE SEEN   Squamous Epithelial / HPF 0-5 0 - 5 /HPF  Type and screen [facility]     Status: None (Preliminary result)   Collection Time: 01/29/24  7:00 AM  Result Value Ref Range   ABO/RH(D) PENDING    Antibody Screen PENDING    Sample Expiration      02/01/2024,2359 Performed at [facility], [address], [place], [place] [address]     DG Chest Port 1 View Result Date: 01/29/2024 CLINICAL DATA:  Sepsis EXAM: PORTABLE CHEST 1 VIEW COMPARISON:  None Available. FINDINGS: The heart size and mediastinal contours are within normal limits. Both lungs are clear. Right convex thoracic scoliosis. IMPRESSION: No active disease. Electronically Signed   By: Deatra Robinson M.D.   On: 01/29/2024 03:05    Assessment/Plan: 22yo [id number] s/p NSVD by JM on 01/24/24 presenting with presumed endometritis with breast engorgement despite benign abdominal exam.  Unasyn ordered as well as labs and CT scan.  Pt just shortly arrived to [facility] specialty care.  I signed pt out to Dr. Sallye Ober once she was stable.  Purcell Nails 01/29/2024, 7:36 AM

## 2024-01-29 NOTE — MAU Note (Signed)
 CRITICAL VALUE STICKER  CRITICAL VALUE:2.2  RECEIVER (on-site recipient of call):  DATE & TIME NOTIFIED: 01/29/24 0347  MESSENGER (representative from lab): Wellington Hampshire from lab.   MD NOTIFIED: NP, Marcell Barlow.    Wellington Hampshire from lab.

## 2024-01-29 NOTE — Progress Notes (Addendum)
 Post Partum Day 5 Subjective: Patient reports feeling tired and lightheaded.  She desires a blood transfusion and not IV iron.   Objective: Blood pressure 117/68, pulse 87, temperature 98.4 F (36.9 C), temperature source Oral, resp. rate 18, height 5\' 7"  (1.702 m), weight 87.3 kg, SpO2 98%, unknown if currently breastfeeding.  Physical Exam:  General: alert, cooperative, and no distress Lochia: appropriate Uterine Fundus: firm Incision: n/a DVT Evaluation: No evidence of DVT seen on physical exam. No significant calf/ankle edema.  Recent Labs    01/29/24 0114 01/29/24 0720  HGB 8.2* 7.3*  HCT 28.9* 25.0*   Narrative & Impression  CLINICAL DATA:  Postpartum day 4 with headache, chills, fever, pain and vaginal bleeding. Nausea vomiting   EXAM: CT ABDOMEN AND PELVIS WITH CONTRAST   TECHNIQUE: Multidetector CT imaging of the abdomen and pelvis was performed using the standard protocol following bolus administration of intravenous contrast.   RADIATION DOSE REDUCTION: This exam was performed according to the departmental dose-optimization program which includes automated exposure control, adjustment of the mA and/or kV according to patient size and/or use of iterative reconstruction technique.   CONTRAST:  75mL OMNIPAQUE IOHEXOL 350 MG/ML SOLN   COMPARISON:  None Available.   FINDINGS: Lower chest: There is some linear opacity lung bases likely scar or atelectasis. No pleural effusion.   Hepatobiliary: Dilated gallbladder. No space-occupying liver lesion. Patent portal vein.   Pancreas: Unremarkable. No pancreatic ductal dilatation or surrounding inflammatory changes.   Spleen: Normal in size without focal abnormality.   Adrenals/Urinary Tract: Adrenal glands are unremarkable. Kidneys are normal, without renal calculi, focal lesion, or hydronephrosis. Bladder is distended.   Stomach/Bowel: Oral contrast was administered. Stomach is collapsed. Small bowel is  nondilated. Normal appendix in the right lower quadrant. Large bowel overall has redone course. No dilatation. There is significant stool in the rectum.   Vascular/Lymphatic: No significant vascular findings are present. No enlarged abdominal or pelvic lymph nodes.   Reproductive: Enlarged postpartum uterus extending into the left mid abdomen. There is some heterogeneous high density material along the course of the endometrium with some bubbles of air. Infection is not excluded. Separate uterine fibroid. No adnexal mass.   Other: No free air or free fluid. Protuberant anterior abdominal wall with slight rectus muscle diastasis.   Musculoskeletal: Levoconvex curvature of the thoracolumbar spine.   IMPRESSION: Postpartum enlarged uterus. There is heterogeneous high density material in the endometrium with some bubbles of air. Please correlate any clinical evidence of infection or endometritis.   No parenchymal gas.  No rim enhancing fluid collection.  No ascites.   Significant stool in the rectum.  No bowel obstruction.   Distended gallbladder. Please correlate with any gallbladder symptomatology and if needed ultrasound.     Electronically Signed   By: Karen Kays M.D.   On: 01/29/2024 13:48    Assessment/Plan: 24 y/o PPD # 5 readmitted with abdominal pain, with possible endometritis, with breast engorgement and symptomatic anemia,  - I discussed with patient risks, benefits and alternatives of blood transfusion including but not limited to risks  of infection (through viruses, bacteria and parasites in donor blood), allergic, hemolytic and other immunologic transfusion reactions, volume overload, iron overload in case of large numbers of transfusion.  She expressed understanding of all this and desires to proceed with the transfusion.  Will give 2 units PRBC and recheck CBC in 4 hours.   - Continue with unasyn.   - Ice packs to breasts.  LOS: 0 days   Prescilla Sours,  MD 01/29/2024, 6:00 PM

## 2024-01-29 NOTE — MAU Provider Note (Signed)
 Chief Complaint:  Abdominal Pain   HPI   Event Date/Time   First Provider Initiated Contact with Patient 01/29/24 0222      Kristina Barnett is a 23 y.o. [id number] at Unknown who presents to maternity admissions as a transfer from the ED . She presented via EMS and is s/p NSVD on 01/24/24 reporting  that she has had a HA x 2 days ( 8/10 on pain scale) since discharged on 01/26/24 which is not responding to Tylenol/Motrin. She said she also felt feverish and had chills with tingling in her feet and hands. Denies heavy VB , no odor, normal lochia and reports mild cramping  Pregnancy Course: [facility] course was uncomplicated per discharge summary   History reviewed. No pertinent past medical history. OB History  Gravida Para Term Preterm AB Living  3 2 2  1 2   SAB IAB Ectopic Multiple Live Births     0 2    # Outcome Date GA Lbr Len/2nd Weight Sex Type Anes PTL Lv  3 Term 01/24/24 [redacted]w[redacted]d 09:28 / 00:51 3350 g M Vag-Spont None  LIV  2 AB 2022          1 Term 06/29/19 [redacted]w[redacted]d 03:32 / 00:42 3340 g M Vag-Spont EPI  LIV   Past Surgical History:  Procedure Laterality Date   NO PAST SURGERIES     History reviewed. No pertinent family history. Social History   Tobacco Use   Smoking status: Never   Smokeless tobacco: Never  Vaping Use   Vaping status: Never Used  Substance Use Topics   Alcohol use: No   Drug use: No   No Known Allergies Medications Prior to Admission  Medication Sig Dispense Refill Last Dose/Taking   acetaminophen (TYLENOL) 160 MG/5ML solution Take 20.3 mLs (650 mg total) by mouth every 4 (four) hours as needed (For pain score < 4).   01/27/2024   ibuprofen (ADVIL) 100 MG/5ML suspension Take 30 mLs (600 mg total) by mouth every 6 (six) hours for 15 days. 1800 mL 0    polyethylene glycol powder (MIRALAX) 17 GM/SCOOP powder Take 255 g by mouth every three (3) days as needed for mild constipation. 255 g 0    Prenatal Vit-Fe Fumarate-FA (PRENATAL VITAMIN)  27-0.8 MG TABS Take 1 tablet by mouth daily. 30 tablet 0     I have reviewed patient's Past Medical Hx, Surgical Hx, Family Hx, Social Hx, medications and allergies.   ROS  Pertinent items noted in HPI and remainder of comprehensive ROS otherwise negative.   PHYSICAL EXAM  Patient Vitals for the past 24 hrs:  BP Temp Temp src Pulse Resp SpO2 Height Weight  01/29/24 0404 -- 98.6 F (37 C) Oral -- -- -- -- --  01/29/24 0216 (!) 106/49 98.6 F (37 C) Oral (!) 107 -- -- -- --  01/29/24 0156 (!) 106/58 100 F (37.8 C) Oral (!) 133 19 99 % 5\' 7"  (1.702 m) 87.3 kg  01/29/24 0045 105/88 (!) 100.8 F (38.2 C) Oral (!) 136 18 100 % -- --    Constitutional: Well-developed, ill appearing female in distress Cardiovascular: tachycardic on arrival  Respiratory: normal effort, LUNGS - BCTA GI: Abd soft, non-tender, no guarding, no rebound , no rigidity on palpation MS: Extremities nontender, no edema, normal ROM Neurologic: Alert and oriented  GU: no CVA tenderness Pelvic: Deferred per patient reports "normal lochia"        Labs: Results for orders placed or performed  during the hospital encounter of 01/29/24 (from the past 24 hours)  Comprehensive metabolic panel     Status: Abnormal   Collection Time: 01/29/24  1:14 AM  Result Value Ref Range   Sodium 135 135 - 145 mmol/L   Potassium 3.6 3.5 - 5.1 mmol/L   Chloride 105 98 - 111 mmol/L   CO2 15 (L) 22 - 32 mmol/L   Glucose, Bld 103 (H) 70 - 99 mg/dL   BUN 9 6 - 20 mg/dL   Creatinine, Ser 3.08 0.44 - 1.00 mg/dL   Calcium 8.5 (L) 8.9 - 10.3 mg/dL   Total Protein 6.9 6.5 - 8.1 g/dL   Albumin 2.6 (L) 3.5 - 5.0 g/dL   AST 59 (H) 15 - 41 U/L   ALT 41 0 - 44 U/L   Alkaline Phosphatase 90 38 - 126 U/L   Total Bilirubin 0.7 0.0 - 1.2 mg/dL   GFR, Estimated >65 >78 mL/min   Anion gap 15 5 - 15  CBC with Differential     Status: Abnormal   Collection Time: 01/29/24  1:14 AM  Result Value Ref Range   WBC 4.6 4.0 - 10.5 K/uL   RBC 3.87  3.87 - 5.11 MIL/uL   Hemoglobin 8.2 (L) 12.0 - 15.0 g/dL   HCT 46.9 (L) 62.9 - 52.8 %   MCV 74.7 (L) 80.0 - 100.0 fL   MCH 21.2 (L) 26.0 - 34.0 pg   MCHC 28.4 (L) 30.0 - 36.0 g/dL   RDW 41.3 (H) 24.4 - 01.0 %   Platelets 209 150 - 400 K/uL   nRBC 0.6 (H) 0.0 - 0.2 %   Neutrophils Relative % 82 %   Neutro Abs 3.8 1.7 - 7.7 K/uL   Lymphocytes Relative 14 %   Lymphs Abs 0.6 (L) 0.7 - 4.0 K/uL   Monocytes Relative 1 %   Monocytes Absolute 0.1 0.1 - 1.0 K/uL   Eosinophils Relative 1 %   Eosinophils Absolute 0.0 0.0 - 0.5 K/uL   Basophils Relative 0 %   Basophils Absolute 0.0 0.0 - 0.1 K/uL   Immature Granulocytes 2 %   Abs Immature Granulocytes 0.08 (H) 0.00 - 0.07 K/uL  Protime-INR     Status: None   Collection Time: 01/29/24  1:14 AM  Result Value Ref Range   Prothrombin Time 15.0 11.4 - 15.2 seconds   INR 1.2 0.8 - 1.2  APTT     Status: None   Collection Time: 01/29/24  1:14 AM  Result Value Ref Range   aPTT 34 24 - 36 seconds  hCG, serum, qualitative     Status: Abnormal   Collection Time: 01/29/24  1:14 AM  Result Value Ref Range   Preg, Serum POSITIVE (A) NEGATIVE  I-Stat Lactic Acid, ED     Status: Abnormal   Collection Time: 01/29/24  1:21 AM  Result Value Ref Range   Lactic Acid, Venous 2.3 (HH) 0.5 - 1.9 mmol/L   Comment NOTIFIED PHYSICIAN   Comprehensive metabolic panel     Status: Abnormal   Collection Time: 01/29/24  3:09 AM  Result Value Ref Range   Sodium 136 135 - 145 mmol/L   Potassium 3.2 (L) 3.5 - 5.1 mmol/L   Chloride 108 98 - 111 mmol/L   CO2 18 (L) 22 - 32 mmol/L   Glucose, Bld 113 (H) 70 - 99 mg/dL   BUN 8 6 - 20 mg/dL   Creatinine, Ser 2.72 0.44 - 1.00 mg/dL   Calcium  8.1 (L) 8.9 - 10.3 mg/dL   Total Protein 5.7 (L) 6.5 - 8.1 g/dL   Albumin 2.1 (L) 3.5 - 5.0 g/dL   AST 43 (H) 15 - 41 U/L   ALT 32 0 - 44 U/L   Alkaline Phosphatase 67 38 - 126 U/L   Total Bilirubin 0.5 0.0 - 1.2 mg/dL   GFR, Estimated >78 >46 mL/min   Anion gap 10 5 - 15   Lactic acid, plasma     Status: Abnormal   Collection Time: 01/29/24  3:09 AM  Result Value Ref Range   Lactic Acid, Venous 2.2 (HH) 0.5 - 1.9 mmol/L    Imaging:  DG Chest Port 1 View Result Date: 01/29/2024 CLINICAL DATA:  Sepsis EXAM: PORTABLE CHEST 1 VIEW COMPARISON:  None Available. FINDINGS: The heart size and mediastinal contours are within normal limits. Both lungs are clear. Right convex thoracic scoliosis. IMPRESSION: No active disease. Electronically Signed   By: Deatra Robinson M.D.   On: 01/29/2024 03:05    MDM & MAU COURSE  MDM:  HIGH  - Labs ordered from ED reviewed elevated lactic acid 2.3 on arrival, with repeat at 2.2 at  - CBC   Lab Results  Component Value Date   HGB 8.2 (L) 01/29/2024   HGB 7.2 (L) 01/25/2024   HGB 10.5 (L) 01/24/2024   HGB 9.8 (L) 07/18/2021   HGB 9.0 (L) 10/18/2020   HGB 8.5 (L) 06/30/2019   HGB 9.5 (L) 06/29/2019   Concern for symptomatic Anemia Elevated Lactic Acid  IVF Bolus x 2  Migraine cocktail  Reassessment : 0400 HA 5/10 on pain scale and patient is still feeling unwell Dr Tarri Glenn OB Attending contacted @ 0400 , in surgery awaiting call back @ 0500 recommendation for admission and iron infusion d/w Dr Vergie Living [facility] Attending) who is in agreement and Dr Su Hilt from [facility] Notified   MAU Course: Orders Placed This Encounter  Procedures   DG Chest Port 1 View   Comprehensive metabolic panel   CBC with Differential   CBC with Differential   Protime-INR   APTT   hCG, serum, qualitative   Urinalysis, w/ Reflex to Culture (Infection Suspected) -Urine, Clean Catch   Comprehensive metabolic panel   Lactic acid, plasma   EKG 12-Lead   Insert peripheral IV X 1   Meds ordered this encounter  Medications   lactated ringers bolus 1,000 mL   metoCLOPramide (REGLAN) injection 10 mg   diphenhydrAMINE (BENADRYL) injection 25 mg   DISCONTD: acetaminophen-caffeine (EXCEDRIN TENSION HEADACHE) 500-65 MG per tablet 2 tablet    acetaminophen (TYLENOL) 160 MG/5ML solution 1,000 mg   lactated ringers bolus 1,000 mL    ASSESSMENT  Symptomatic Anemia  PLAN   Admit to [facility] for Iron infusion and observation  Dr Su Hilt [facility] Private Attending aware of recommendations and is agreeable with admission plan) Transfer to OBS Marcell Barlow, MSN, [facility], [facility]

## 2024-01-29 NOTE — MAU Note (Signed)
.  Kristina Barnett is a 23 y.o. at Unknown here in MAU reporting: PP vaginal delivery on 3/6 - here from main ED - states she has not felt good since being d/c home on Saturday - ongoing HA that has been coming and going - denies current HA. Today around 2300 started having chills, tingling sensation in feet and hands, numbing in feet, seeing stars, lower abdominal cramping, and dizziness. Also reporting nausea, but denies emesis. Denies complications with the pregnancy. Denies current pain. Took tylenol around 2000  Pain score: 0 Vitals:   01/29/24 0045 01/29/24 0156  BP: 105/88 (!) 106/58  Pulse: (!) 136 (!) 133  Resp: 18 19  Temp: (!) 100.8 F (38.2 C) 100 F (37.8 C)  SpO2: 100% 99%     FHT: NA   Lab orders placed from triage: none

## 2024-01-29 NOTE — Plan of Care (Signed)

## 2024-01-30 ENCOUNTER — Inpatient Hospital Stay (HOSPITAL_COMMUNITY)

## 2024-01-30 LAB — TYPE AND SCREEN
ABO/RH(D): O POS
Antibody Screen: NEGATIVE
Unit division: 0
Unit division: 0

## 2024-01-30 LAB — BPAM RBC
Blood Product Expiration Date: 202503282359
Blood Product Expiration Date: 202504052359
ISSUE DATE / TIME: 202503111100
ISSUE DATE / TIME: 202503111430
Unit Type and Rh: 5100
Unit Type and Rh: 5100

## 2024-01-30 LAB — D-DIMER, QUANTITATIVE: D-Dimer, Quant: 6.9 ug{FEU}/mL — ABNORMAL HIGH (ref 0.00–0.50)

## 2024-01-30 MED ORDER — FUROSEMIDE 10 MG/ML IJ SOLN
20.0000 mg | Freq: Once | INTRAMUSCULAR | Status: AC
  Administered 2024-01-30: 20 mg via INTRAVENOUS
  Filled 2024-01-30: qty 2

## 2024-01-30 MED ORDER — IOHEXOL 350 MG/ML SOLN
65.0000 mL | Freq: Once | INTRAVENOUS | Status: AC | PRN
  Administered 2024-01-30: 65 mL via INTRAVENOUS

## 2024-01-30 NOTE — Progress Notes (Addendum)
 Called by RN with report of pt reporting SOB since 1700 but didn't tell anyone.  O2 sat decreased to the 80s-low 90s.  96% now on 2l.  Will order CT scan to eval for PE stat.  Pt has had no calf tenderness and SCDs had been ordered.  D-dimer added on to previous labs.  RN also reports a slight cough although the pt is not c/o URI type sxs.  Will check respiratory panel.

## 2024-01-30 NOTE — Progress Notes (Signed)
 Post Partum Day 6 Subjective: no complaints, up ad lib, voiding, tolerating PO, + flatus, and +BM  Objective: Blood pressure 117/62, pulse 75, temperature 99 F (37.2 C), temperature source Oral, resp. rate 17, height 5\' 7"  (1.702 m), weight 87.3 kg, SpO2 97%, unknown if currently breastfeeding.  Physical Exam:  General: alert, cooperative, and no distress Lochia: appropriate Uterine Fundus: FF, NT Incision: n/a DVT Evaluation: No evidence of DVT seen on physical exam.  Recent Labs    01/29/24 0720 01/29/24 2136  HGB 7.3* 9.2*  HCT 25.0* 29.0*    Assessment/Plan: Overall improving s/p CT scan showing endometritis and also with breast engorgement Plan for discharge tomorrow on a total of 7 days of antibiotics if remains afebrile S/p 2u PRBCs for symptomatic anemia    LOS: 1 day   Purcell Nails, MD 01/30/2024, 11:10 AM

## 2024-01-31 ENCOUNTER — Other Ambulatory Visit (HOSPITAL_COMMUNITY)

## 2024-01-31 LAB — RESPIRATORY PANEL BY PCR

## 2024-01-31 LAB — CULTURE, BLOOD (ROUTINE X 2): Special Requests: ADEQUATE

## 2024-01-31 MED ORDER — SODIUM CHLORIDE 0.9 % IV SOLN
500.0000 mg | INTRAVENOUS | Status: DC
  Administered 2024-01-31 – 2024-02-02 (×3): 500 mg via INTRAVENOUS
  Filled 2024-01-31 (×3): qty 5

## 2024-01-31 MED ORDER — FUROSEMIDE 10 MG/ML IJ SOLN
20.0000 mg | Freq: Once | INTRAMUSCULAR | Status: AC
  Administered 2024-01-31: 20 mg via INTRAVENOUS
  Filled 2024-01-31: qty 2

## 2024-01-31 MED ORDER — FUROSEMIDE 10 MG/ML IJ SOLN: 20.0000 mg | Freq: Once | INTRAMUSCULAR | Status: DC

## 2024-01-31 MED ORDER — SODIUM CHLORIDE 0.9 % IV SOLN: INTRAVENOUS | Status: AC

## 2024-01-31 NOTE — Progress Notes (Signed)
 Post Partum Day 7 Subjective: up ad lib, voiding, and tolerating PO.  Patient reports occasional coughing.  She denies chest pain or shortness of breath.   Objective: Blood pressure 137/82, pulse 88, temperature 97.9 F (36.6 C), temperature source Oral, resp. rate (!) 22, height 5\' 7"  (1.702 m), weight 87.3 kg, SpO2 96%, unknown if currently breastfeeding.    01/31/2024   11:15 AM 01/31/2024    7:41 AM 01/31/2024    4:56 AM  Vitals with BMI  Systolic 137 126 161  Diastolic 82 91 79  Pulse 88 78 82    I/O last 3 completed shifts: In: 740 [P.O.:240; IV Piggyback:500] Out: 1150 [Urine:1150]  Physical Exam:  General: alert, cooperative, and no distress CVS: S1, S2, regular rate and rhythm.  Pulmonary: With bilateral crackles scattered in lungs bilaterally.   Lochia: appropriate Uterine Fundus: firm Incision: N/A DVT Evaluation: No evidence of DVT seen on physical exam. No significant calf/ankle edema.  Recent Labs    01/29/24 0720 01/29/24 2136  HGB 7.3* 9.2*  HCT 25.0* 29.0*    Narrative & Impression  CLINICAL DATA:  Sepsis   EXAM: CT ANGIOGRAPHY CHEST WITH CONTRAST   TECHNIQUE: Multidetector CT imaging of the chest was performed using the standard protocol during bolus administration of intravenous contrast. Multiplanar CT image reconstructions and MIPs were obtained to evaluate the vascular anatomy.   RADIATION DOSE REDUCTION: This exam was performed according to the departmental dose-optimization program which includes automated exposure control, adjustment of the mA and/or kV according to patient size and/or use of iterative reconstruction technique.   CONTRAST:  65mL OMNIPAQUE IOHEXOL 350 MG/ML SOLN   COMPARISON:  Chest x-ray 01/29/2024   FINDINGS: Cardiovascular: Satisfactory opacification of the pulmonary arteries to the segmental level. No evidence of pulmonary embolism. Nonaneurysmal aorta. No dissection. Borderline cardiac enlargement. No  significant pericardial effusion   Mediastinum/Nodes: Patent trachea. No thyroid mass. No suspicious lymph nodes. Esophagus within normal limits   Lungs/Pleura: Small bilateral pleural effusions. Diffuse bilateral septal thickening and hazy pulmonary density throughout consistent with background pulmonary edema. In addition, widespread bilateral peribronchovascular nodularity, and small foci of consolidation in the left lower lobe   Upper Abdomen: No acute finding   Musculoskeletal: No acute osseous Abner   Review of the MIP images confirms the above findings.   IMPRESSION: 1. Negative for acute pulmonary embolus. 2. Borderline cardiomegaly with small bilateral pleural effusions with diffuse bilateral septal thickening and hazy pulmonary density throughout consistent with underlying pulmonary edema. 3. Widespread bilateral peribronchovascular nodularity and small foci of consolidation in the left lower lobe, findings are suspicious for multifocal pneumonia.      Assessment/Plan: 23 y/o PPD # 5, readmitted on 01/29/24 for endometritis, breast engorgement and symptomatic anemia, and now with pulmonary edema and pneumonia, - Continue Unasyn and azithromycin for infection. Follow up on blood culture sensitivities. - Lasix for diuresis. - On oxygen 1l/min,plan on weaning off as tolerated.  - s/p 2 units blood and with improved hemoglobin. - For cardiac echo given bordeline cardiomegaly and pulmonary edema findings on chest CT.   LOS: 2 days   Prescilla Sours, MD 01/31/2024, 12:35 PM

## 2024-01-31 NOTE — Progress Notes (Addendum)
 CT scan of chest was negative but showed pulmonary edema with small pleural effusions and multifocal pneumonia Lasix ordered and abx broadened to include azithromycin.  Strict Is/Os.  Respiratory panel pending.

## 2024-02-01 ENCOUNTER — Inpatient Hospital Stay (HOSPITAL_COMMUNITY)

## 2024-02-01 DIAGNOSIS — R0609 Other forms of dyspnea: Secondary | ICD-10-CM

## 2024-02-01 LAB — ECHOCARDIOGRAM COMPLETE
AR max vel: 2.4 cm2
AV Area VTI: 2.32 cm2
AV Area mean vel: 2.29 cm2
AV Mean grad: 4 mmHg
AV Peak grad: 7.2 mmHg
Ao pk vel: 1.34 m/s
Area-P 1/2: 4.31 cm2
Height: 67 in
S' Lateral: 3.05 cm
Weight: 3078.4 [oz_av]

## 2024-02-01 LAB — URINE CULTURE: Culture: >=100000 — AB

## 2024-02-01 NOTE — Progress Notes (Signed)
 PPD# 8 Hospital readmission day #3  S/P SVD on 01/24/24 with an uncomplicated labor and immediate postpartum period  S:   Reports feeling "much better". O2 via nasal canula has been reduced to 1L/hr. Pt denies SOB during the night and was able to rest in a supine position. Ambulating independently and denies resp distress with activity. Denies abd pain. Discussed penging blood culture results and to expect the echo to be completed today.    O:   VS: BP 125/78 (BP Location: Left Arm)   Pulse 62   Temp 98.1 F (36.7 C) (Oral)   Resp (!) 22   Ht 5\' 7"  (1.702 m)   Wt 87.3 kg   SpO2 97%   BMI 30.13 kg/m   LABS:     Latest Ref Rng & Units 01/29/2024    9:36 PM 01/29/2024    7:20 AM 01/29/2024    1:14 AM  CBC  WBC 4.0 - 10.5 K/uL 7.8  7.2  4.6   Hemoglobin 12.0 - 15.0 g/dL 9.2  7.3  8.2   Hematocrit 36.0 - 46.0 % 29.0  25.0  28.9   Platelets 150 - 400 K/uL 151  181  209    Blood Culture    Component Value Date/Time   SDES FLUID 01/29/2024 0800   SPECREQUEST BREAST 01/29/2024 0800   CULT  01/29/2024 0800    MODERATE STAPHYLOCOCCUS LUGDUNENSIS MODERATE STAPHYLOCOCCUS CAPITIS SUSCEPTIBILITIES TO FOLLOW Performed at [facility], [address] [place], [place] [address]    REPTSTATUS PENDING 01/29/2024 0800                          I&O: Intake/Output      03/13 0701 03/14 0700 03/14 0701 03/15 0700   P.O. 2160    I.V. (mL/kg) 47.4 (0.5)    IV Piggyback 900.1    Total Intake(mL/kg) 3107.5 (35.6)    Urine (mL/kg/hr) 1750 (0.8)    Stool 0    Total Output 1750    Net +1357.5         Stool Occurrence 2 x      Physical Exam: Gen: Alert and oriented X3 Lungs: Clear and unlabored Heart: regular rate and rhythm / no mumurs Abdomen: soft, non-tender, non-distended  Extremities: 1+edema, negative for pain, tenderness, and cords   Assessment/Plan: PPD # 8 Readmitted for endometritis    -continue Unasyn 3G, Azithromycin Pulmonary edema    -S/P lasix, strict I/O,  SCD, wean Q2 to room air Pneumonia    -continue antibiotics Symptomatic anemia      -S/P 2 units PRBC, Hgb stable   LOS: 3 days   Plan reviewed w/ Dr. Tresa Moore DNP, CNM 02/01/2024 8:05 AM

## 2024-02-01 NOTE — Progress Notes (Signed)
*  PRELIMINARY RESULTS* Echocardiogram 2D Echocardiogram has been performed.  Kristina Barnett 02/01/2024, 8:44 AM

## 2024-02-02 ENCOUNTER — Telehealth (HOSPITAL_COMMUNITY): Payer: Self-pay

## 2024-02-02 MED ORDER — AZITHROMYCIN 200 MG/5ML PO SUSR: 250.0000 mg | Freq: Every day | ORAL | 0 refills | Status: AC

## 2024-02-02 MED ORDER — POLYSACCHARIDE IRON COMPLEX 100 MG/5ML PO LIQD: 150.0000 mg | Freq: Every day | ORAL | 1 refills | Status: AC

## 2024-02-02 MED ORDER — AMOXICILLIN-POT CLAVULANATE 400-57 MG/5ML PO SUSR: 875.0000 mg | Freq: Two times a day (BID) | ORAL | 0 refills | Status: AC

## 2024-02-02 NOTE — Discharge Summary (Signed)
 Physician Discharge Summary  Patient ID: Kristina Barnett MRN: [id number] DOB/AGE: 25/30/2002 25 y.o.  Admit date: 01/29/2024 Discharge date: 02/02/2024  Admission Diagnoses:  Discharge Diagnoses:  Principal Problem:   Anemia Active Problems:   Fever   Discharged Condition: good  Hospital Course:Antibiotic therapy and infectious disease workup for endometritis, respiratory care for pulmonary edema and pneumonia, red blood cell replacement for symptomatic anemia  Consults: cardiology and pulmonary/intensive care  Significant Diagnostic Studies: labs, microbiology: blood culture: positive for e. coli , and cardiac graphics: ECG: normal  and Echocardiogram: normal  Treatments: IV hydration, antibiotics: Unasyn and azithromycin, and respiratory therapy: O2  Discharge Exam: Blood pressure (!) 135/90, pulse (!) 44, temperature 97.8 F (36.6 C), temperature source Oral, resp. rate 18, height 5\' 7"  (1.702 m), weight 87.3 kg, SpO2 97%, unknown if currently breastfeeding. General appearance: alert, cooperative, and no distress Resp: clear to auscultation bilaterally Chest wall: no tenderness Cardio: regular rate and rhythm, S1, S2 normal, no murmur, click, rub or gallop GI: soft, non-tender; bowel sounds normal; no masses,  no organomegaly  Disposition: Discharge disposition: 01-Home or Self Care       Discharge Instructions     Activity as tolerated   Complete by: As directed    Call MD for:   Complete by: As directed    Call MD for:  difficulty breathing, headache or visual disturbances   Complete by: As directed    Call MD for:  extreme fatigue   Complete by: As directed    Call MD for:  hives   Complete by: As directed    Call MD for:  persistant dizziness or light-headedness   Complete by: As directed    Call MD for:  persistant nausea and vomiting   Complete by: As directed    Call MD for:  redness, tenderness, or signs of infection (pain, swelling, redness,  odor or green/yellow discharge around incision site)   Complete by: As directed    Call MD for:  severe uncontrolled pain   Complete by: As directed    Call MD for:  temperature >100.4   Complete by: As directed    Diet general   Complete by: As directed    Include iron-rich foods   Discharge instructions   Complete by: As directed    See handouts      Allergies as of 02/02/2024   No Known Allergies      Medication List     TAKE these medications    acetaminophen 160 MG/5ML solution Commonly known as: TYLENOL Take 20.3 mLs (650 mg total) by mouth every 4 (four) hours as needed (For pain score < 4).   amoxicillin-clavulanate 400-57 MG/5ML suspension Commonly known as: AUGMENTIN Take 10.9 mLs (875 mg total) by mouth 2 (two) times daily for 7 days.   azithromycin 200 MG/5ML suspension Commonly known as: ZITHROMAX Take 6.3 mLs (250 mg total) by mouth daily for 5 days.   ibuprofen 100 MG/5ML suspension Commonly known as: ADVIL Take 30 mLs (600 mg total) by mouth every 6 (six) hours for 15 days.   polyethylene glycol powder 17 GM/SCOOP powder Commonly known as: MiraLax Take 255 g by mouth every three (3) days as needed for mild constipation.   Polysaccharide Iron Complex 100 MG/5ML Liqd Take 150 mg by mouth daily at 12 noon.   Prenatal Vitamin 27-0.8 MG Tabs Take 1 tablet by mouth daily.        Follow-up Information     [facility]. Schedule an appointment as soon as possible for a visit.   Specialty: Obstetrics and Gynecology Contact information: [address] [place] [address] [phone]                Signed: Roma Schanz 02/02/2024, 10:36 AM

## 2024-02-02 NOTE — Telephone Encounter (Signed)
 02/02/2024 1610  Name: Kristina Barnett MRN: [id number] DOB: 2001/05/08  Reason for Call:  Transition of Care Hospital Discharge Call  Contact Status: Patient Contact Status: Message  Language assistant needed:          Follow-Up Questions:    Inocente Salles Postnatal Depression Scale:  In the Past 7 Days:    PHQ2-9 Depression Scale:     Discharge Follow-up:    Post-discharge interventions: NA  Signature  Signe Colt

## 2024-02-03 LAB — CULTURE, BLOOD (ROUTINE X 2): Culture: NO GROWTH

## 2024-02-03 LAB — BODY FLUID CULTURE W GRAM STAIN

## 2024-04-10 ENCOUNTER — Encounter (HOSPITAL_COMMUNITY): Payer: Self-pay

## 2024-04-10 ENCOUNTER — Other Ambulatory Visit: Payer: Self-pay

## 2024-04-10 ENCOUNTER — Emergency Department (HOSPITAL_COMMUNITY)

## 2024-04-10 ENCOUNTER — Emergency Department (HOSPITAL_COMMUNITY)
Admission: EM | Admit: 2024-04-10 | Discharge: 2024-04-10 | Disposition: A | Discharge status: Home / Self Care | Attending: Emergency Medicine | Admitting: Emergency Medicine

## 2024-04-10 DIAGNOSIS — R0781 Pleurodynia: Secondary | ICD-10-CM | POA: Insufficient documentation

## 2024-04-10 DIAGNOSIS — R109 Unspecified abdominal pain: Secondary | ICD-10-CM | POA: Insufficient documentation

## 2024-04-10 DIAGNOSIS — D259 Leiomyoma of uterus, unspecified: Secondary | ICD-10-CM

## 2024-04-10 DIAGNOSIS — R519 Headache, unspecified: Secondary | ICD-10-CM | POA: Diagnosis not present

## 2024-04-10 DIAGNOSIS — R6884 Jaw pain: Secondary | ICD-10-CM

## 2024-04-10 DIAGNOSIS — M545 Low back pain, unspecified: Secondary | ICD-10-CM

## 2024-04-10 HISTORY — DX: Iron deficiency: E61.1

## 2024-04-10 LAB — CBC WITH DIFFERENTIAL/PLATELET
Abs Immature Granulocytes: 0.02 10*3/uL (ref 0.00–0.07)
Basophils Absolute: 0 10*3/uL (ref 0.0–0.1)
Basophils Relative: 1 %
Eosinophils Absolute: 0 10*3/uL (ref 0.0–0.5)
Eosinophils Relative: 1 %
HCT: 39 % (ref 36.0–46.0)
Hemoglobin: 12 g/dL (ref 12.0–15.0)
Immature Granulocytes: 0 %
Lymphocytes Relative: 34 %
Lymphs Abs: 2 10*3/uL (ref 0.7–4.0)
MCH: 25.4 pg — ABNORMAL LOW (ref 26.0–34.0)
MCHC: 30.8 g/dL (ref 30.0–36.0)
MCV: 82.6 fL (ref 80.0–100.0)
Monocytes Absolute: 0.4 10*3/uL (ref 0.1–1.0)
Monocytes Relative: 6 %
Neutro Abs: 3.4 10*3/uL (ref 1.7–7.7)
Neutrophils Relative %: 58 %
Platelets: 361 10*3/uL (ref 150–400)
RBC: 4.72 MIL/uL (ref 3.87–5.11)
RDW: 17.8 % — ABNORMAL HIGH (ref 11.5–15.5)
WBC: 5.9 10*3/uL (ref 4.0–10.5)
nRBC: 0 % (ref 0.0–0.2)

## 2024-04-10 LAB — COMPREHENSIVE METABOLIC PANEL WITH GFR
ALT: 88 U/L — ABNORMAL HIGH (ref 0–44)
AST: 84 U/L — ABNORMAL HIGH (ref 15–41)
Albumin: 4.4 g/dL (ref 3.5–5.0)
Alkaline Phosphatase: 60 U/L (ref 38–126)
Anion gap: 14 (ref 5–15)
BUN: 11 mg/dL (ref 6–20)
CO2: 19 mmol/L — ABNORMAL LOW (ref 22–32)
Calcium: 10 mg/dL (ref 8.9–10.3)
Chloride: 107 mmol/L (ref 98–111)
Creatinine, Ser: 0.9 mg/dL (ref 0.44–1.00)
GFR, Estimated: >60 mL/min (ref >60)
Glucose, Bld: 84 mg/dL (ref 70–99)
Potassium: 3.8 mmol/L (ref 3.5–5.1)
Sodium: 140 mmol/L (ref 135–145)
Total Bilirubin: 1 mg/dL (ref 0.0–1.2)
Total Protein: 8.6 g/dL — ABNORMAL HIGH (ref 6.5–8.1)

## 2024-04-10 LAB — HCG, QUANTITATIVE, PREGNANCY: hCG, Beta Chain, Quant, S: <1 m[IU]/mL (ref <5)

## 2024-04-10 MED ORDER — SODIUM CHLORIDE 0.9 % IV BOLUS
500.0000 mL | Freq: Once | INTRAVENOUS | Status: AC
  Administered 2024-04-10: 500 mL via INTRAVENOUS

## 2024-04-10 MED ORDER — IBUPROFEN 600 MG PO TABS: 600.0000 mg | ORAL_TABLET | Freq: Four times a day (QID) | ORAL | 0 refills | Status: AC | PRN

## 2024-04-10 MED ORDER — CYCLOBENZAPRINE HCL 10 MG PO TABS: 10.0000 mg | ORAL_TABLET | Freq: Two times a day (BID) | ORAL | 0 refills | Status: AC | PRN

## 2024-04-10 MED ORDER — IOHEXOL 300 MG/ML  SOLN
100.0000 mL | Freq: Once | INTRAMUSCULAR | Status: AC | PRN
  Administered 2024-04-10: 100 mL via INTRAVENOUS

## 2024-04-10 MED ORDER — KETOROLAC TROMETHAMINE 30 MG/ML IJ SOLN: 30.0000 mg | Freq: Four times a day (QID) | INTRAMUSCULAR | Status: DC | PRN
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
## 2024-04-10 NOTE — Discharge Instructions (Addendum)
 Please schedule follow-up appointment with your primary care clinic in 1 week to recheck your symptoms.
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
## 2024-04-10 NOTE — ED Notes (Signed)
 Pt HR appears irregular. Beating fast and then slow then fast within the range of 63 to 84; the change is minor. Pt has no complaints at this time. Pt reports being aware of this cardiac activity; first noted when she was in the hospital during pregnancy. Nothing was done about it; pt reports it raised no concerns at that time. Provider notified.

## 2024-05-17 ENCOUNTER — Other Ambulatory Visit: Payer: Self-pay

## 2024-05-17 ENCOUNTER — Encounter (HOSPITAL_COMMUNITY): Payer: Self-pay | Admitting: *Deleted

## 2024-05-17 ENCOUNTER — Emergency Department (HOSPITAL_COMMUNITY)
Admission: EM | Admit: 2024-05-17 | Discharge: 2024-05-18 | Disposition: A | Discharge status: Home / Self Care | Attending: Emergency Medicine | Admitting: Emergency Medicine

## 2024-05-17 DIAGNOSIS — K5900 Constipation, unspecified: Secondary | ICD-10-CM | POA: Insufficient documentation

## 2024-05-17 LAB — CBC
HCT: 38.9 % (ref 36.0–46.0)
Hemoglobin: 12.3 g/dL (ref 12.0–15.0)
MCH: 26.3 pg (ref 26.0–34.0)
MCHC: 31.6 g/dL (ref 30.0–36.0)
MCV: 83.1 fL (ref 80.0–100.0)
Platelets: 351 10*3/uL (ref 150–400)
RBC: 4.68 MIL/uL (ref 3.87–5.11)
RDW: 14.7 % (ref 11.5–15.5)
WBC: 7.3 10*3/uL (ref 4.0–10.5)
nRBC: 0 % (ref 0.0–0.2)

## 2024-05-17 LAB — COMPREHENSIVE METABOLIC PANEL WITH GFR
ALT: 21 U/L (ref 0–44)
AST: 24 U/L (ref 15–41)
Albumin: 4.2 g/dL (ref 3.5–5.0)
Alkaline Phosphatase: 50 U/L (ref 38–126)
Anion gap: 10 (ref 5–15)
BUN: 14 mg/dL (ref 6–20)
CO2: 21 mmol/L — ABNORMAL LOW (ref 22–32)
Calcium: 9.7 mg/dL (ref 8.9–10.3)
Chloride: 107 mmol/L (ref 98–111)
Creatinine, Ser: 0.81 mg/dL (ref 0.44–1.00)
GFR, Estimated: >60 mL/min (ref >60)
Glucose, Bld: 89 mg/dL (ref 70–99)
Potassium: 4 mmol/L (ref 3.5–5.1)
Sodium: 138 mmol/L (ref 135–145)
Total Bilirubin: 0.4 mg/dL (ref 0.0–1.2)
Total Protein: 8.2 g/dL — ABNORMAL HIGH (ref 6.5–8.1)

## 2024-05-17 LAB — URINALYSIS, ROUTINE W REFLEX MICROSCOPIC
Bilirubin Urine: NEGATIVE
Glucose, UA: NEGATIVE mg/dL
Hgb urine dipstick: NEGATIVE
Ketones, ur: NEGATIVE mg/dL
Nitrite: NEGATIVE
Protein, ur: 30 mg/dL — AB
Specific Gravity, Urine: 1.018 (ref 1.005–1.030)
Squamous Epithelial / HPF: >50 /HPF (ref 0–5)
WBC, UA: >50 WBC/hpf (ref 0–5)
pH: 5 (ref 5.0–8.0)

## 2024-05-17 LAB — HCG, SERUM, QUALITATIVE: Preg, Serum: NEGATIVE

## 2024-05-17 NOTE — ED Triage Notes (Signed)
 The pt reports that she has bowel problems since she was a child  this episode of constipation has been for 3-4 weeks     lmp June 9th

## 2024-05-18 MED ORDER — MAGNESIUM CITRATE PO SOLN
1.0000 | Freq: Once | ORAL | Status: AC
  Administered 2024-05-18: 1 via ORAL
  Filled 2024-05-18: qty 296

## 2024-05-18 NOTE — ED Provider Notes (Signed)
 [place] EMERGENCY DEPARTMENT AT [facility] Provider Note   CSN: [id number] Arrival date & time: 05/17/24  2023     Patient presents with: constipated   Kristina Barnett is a 23 y.o. female.   23 year old female with a cc of constipation.  She states she has been constipated for 3-4 weeks. She states that she has some stools but feels there is more in.  On further inquiry she states that she has always been like this but has never been formally diagnosed with anything or has been to the doctor for this condition. She says some laxatives work but the main thing that works and what she is requesting today is an enema.  She states she has gotten enemas in the past for the same condition.  She states she deosn't eat a lot of fiber and is a picky eater.  Denies rectal bleeding, black tarry stools, and vomitting.  She is able to eat and drink. Doesn't take any medications and denies any allergies. Denies vomiting. No prior abdominal surgeries. Has tried Dulcolax x 1 without relief.        Prior to Admission medications   Medication Sig Start Date End Date Taking? Authorizing Provider  acetaminophen  (TYLENOL ) 160 MG/5ML solution Take 20.3 mLs (650 mg total) by mouth every 4 (four) hours as needed (For pain score < 4). 01/26/24   Grice, Vivian B, CNM  cyclobenzaprine  (FLEXERIL ) 10 MG tablet Take 1 tablet (10 mg total) by mouth 2 (two) times daily as needed for up to 20 doses for muscle spasms. 04/10/24   Cottie Donnice PARAS, MD  ibuprofen  (ADVIL ) 600 MG tablet Take 1 tablet (600 mg total) by mouth every 6 (six) hours as needed for up to 30 doses. 04/10/24   Cottie Donnice PARAS, MD  polyethylene glycol powder (MIRALAX ) 17 GM/SCOOP powder Take 255 g by mouth every three (3) days as needed for mild constipation. 11/29/23   Pouch Earnie CROME, CNM  Polysaccharide Iron  Complex 100 MG/5ML LIQD Take 150 mg by mouth daily at 12 noon. 02/02/24 03/03/24  Grice, Vivian B, CNM  Prenatal Vit-Fe  Fumarate-FA (PRENATAL VITAMIN) 27-0.8 MG TABS Take 1 tablet by mouth daily. 10/19/20   Raford Lenis, MD  ferrous sulfate  325 (65 FE) MG tablet Take 1 tablet (325 mg total) by mouth 2 (two) times daily with a meal. Patient not taking: Reported on 10/19/2020 07/01/19 10/19/20  Armond Cape, MD    Allergies: Patient has no known allergies.    Review of Systems Negative except as per HPI Updated Vital Signs BP 136/68 (BP Location: Right Arm)   Pulse 97   Temp 98.6 F (37 C) (Oral)   Resp 18   Ht 5' 7 (1.702 m)   Wt 87.3 kg   LMP 04/28/2024   SpO2 100%   BMI 30.14 kg/m   Physical Exam Vitals and nursing note reviewed.  Constitutional:      General: She is not in acute distress.    Appearance: She is well-developed. She is not diaphoretic.  HENT:     Head: Normocephalic and atraumatic.  Pulmonary:     Effort: Pulmonary effort is normal.  Abdominal:     Palpations: Abdomen is soft.     Tenderness: There is no abdominal tenderness. There is no guarding or rebound.   Neurological:     Mental Status: She is alert and oriented to person, place, and time.   Psychiatric:  Behavior: Behavior normal.     (all labs ordered are listed, but only abnormal results are displayed) Labs Reviewed  COMPREHENSIVE METABOLIC PANEL WITH GFR - Abnormal; Notable for the following components:      Result Value   CO2 21 (*)    Total Protein 8.2 (*)    All other components within normal limits  URINALYSIS, ROUTINE W REFLEX MICROSCOPIC - Abnormal; Notable for the following components:   APPearance TURBID (*)    Protein, ur 30 (*)    Leukocytes,Ua LARGE (*)    Bacteria, UA MANY (*)    All other components within normal limits  CBC  HCG, SERUM, QUALITATIVE    EKG: None  Radiology: No results found.   Procedures   Medications Ordered in the ED  magnesium citrate solution 1 Bottle (has no administration in time range)                                    Medical Decision  Making  23 year old female with history of constipation presents with complaint of incomplete emptying of bowels.  Patient has tried Dulcolax without relief.  On exam, bowel sounds are normal, no significant abdominal tenderness.  Urinalysis with large leukocytes although is contaminated sample and likely not true UTI.  hCG is negative.  CBC and CMP without significant findings.  Patient is a hall bed currently.  Will attempt magnesium citrate.  Discussed with patient who requests Foley enema in the bathroom.  Advised patient that we do not have the ability to provide a private bed for this at this time, will start with magnesium citrate and moved to bed for enema if needed.  Care signed out at change of shift pending treatment.     Final diagnoses:  Constipation, unspecified constipation type    ED Discharge Orders     None          [name] 05/18/24 9389    Carita Senior, MD 05/18/24 [phone]

## 2024-05-18 NOTE — ED Provider Notes (Signed)
 Patient care taken over at shift change. Patient given Magnesium Citrate for constipation, will reevaluate to see if she is able to have normal bowel movement.  Physical Exam  BP 136/68 (BP Location: Right Arm)   Pulse 97   Temp 98.6 F (37 C) (Oral)   Resp 18   Ht 5' 7 (1.702 m)   Wt 87.3 kg   LMP 04/28/2024   SpO2 100%   BMI 30.14 kg/m   Physical Exam  Procedures  Procedures  ED Course / MDM    Medical Decision Making Risk OTC drugs.   6:50 AM - per nurse, patient had an episode of vomiting while drinking mag citrate. She then went to the bathroom to try to have bowel movement.  7:30 AM - patient eloped from the ED. No in the bathroom or at her bed.    Waddell Sluder, PA-C 05/18/24 9245    Carita Senior, MD 05/18/24 [phone]
